# Patient Record
Sex: Female | Born: 1975
Health system: Southern US, Community
[De-identification: ages and names within clinical notes are randomized; demographics above are authoritative.]

## PROBLEM LIST (undated history)

## (undated) DIAGNOSIS — F419 Anxiety disorder, unspecified: Secondary | ICD-10-CM

## (undated) DIAGNOSIS — T7840XA Allergy, unspecified, initial encounter: Secondary | ICD-10-CM

## (undated) DIAGNOSIS — F329 Major depressive disorder, single episode, unspecified: Secondary | ICD-10-CM

## (undated) DIAGNOSIS — K219 Gastro-esophageal reflux disease without esophagitis: Secondary | ICD-10-CM

## (undated) DIAGNOSIS — Z9889 Other specified postprocedural states: Secondary | ICD-10-CM

## (undated) DIAGNOSIS — G4730 Sleep apnea, unspecified: Secondary | ICD-10-CM

## (undated) DIAGNOSIS — R112 Nausea with vomiting, unspecified: Secondary | ICD-10-CM

## (undated) DIAGNOSIS — F32A Depression, unspecified: Secondary | ICD-10-CM

## (undated) DIAGNOSIS — J45909 Unspecified asthma, uncomplicated: Secondary | ICD-10-CM

## (undated) HISTORY — DX: Depression, unspecified: F32.A

## (undated) HISTORY — PX: ESOPHAGOGASTRODUODENOSCOPY: SHX1529

## (undated) HISTORY — PX: SINOSCOPY: SHX187

## (undated) HISTORY — PX: WISDOM TOOTH EXTRACTION: SHX21

## (undated) HISTORY — DX: Major depressive disorder, single episode, unspecified: F32.9

## (undated) HISTORY — DX: Allergy, unspecified, initial encounter: T78.40XA

---

## 2013-09-26 ENCOUNTER — Encounter: Payer: Self-pay | Admitting: *Deleted

## 2013-10-11 ENCOUNTER — Ambulatory Visit (INDEPENDENT_AMBULATORY_CARE_PROVIDER_SITE_OTHER): Payer: BC Managed Care – PPO | Admitting: Family Medicine

## 2013-10-11 ENCOUNTER — Encounter: Payer: Self-pay | Admitting: Family Medicine

## 2013-10-11 VITALS — BP 118/62 | HR 78 | Temp 98.3°F | Resp 16 | Ht 67.0 in | Wt 210.0 lb

## 2013-10-11 DIAGNOSIS — R12 Heartburn: Secondary | ICD-10-CM

## 2013-10-11 DIAGNOSIS — J302 Other seasonal allergic rhinitis: Secondary | ICD-10-CM | POA: Insufficient documentation

## 2013-10-11 DIAGNOSIS — F329 Major depressive disorder, single episode, unspecified: Secondary | ICD-10-CM | POA: Insufficient documentation

## 2013-10-11 DIAGNOSIS — E669 Obesity, unspecified: Secondary | ICD-10-CM

## 2013-10-11 DIAGNOSIS — J309 Allergic rhinitis, unspecified: Secondary | ICD-10-CM

## 2013-10-11 DIAGNOSIS — F331 Major depressive disorder, recurrent, moderate: Secondary | ICD-10-CM

## 2013-10-11 MED ORDER — FLUTICASONE PROPIONATE 50 MCG/ACT NA SUSP: 2.0000 | Freq: Every day | NASAL | Status: DC

## 2013-10-11 MED ORDER — VORTIOXETINE HBR 5 MG PO TABS: ORAL_TABLET | ORAL | Status: DC

## 2013-10-11 MED ORDER — OMEPRAZOLE 20 MG PO CPDR: 20.0000 mg | DELAYED_RELEASE_CAPSULE | Freq: Every day | ORAL | Status: DC

## 2013-10-11 MED ORDER — FLUOXETINE HCL 10 MG PO CAPS: 10.0000 mg | ORAL_CAPSULE | Freq: Every day | ORAL | Status: DC

## 2013-10-11 NOTE — Assessment & Plan Note (Signed)
She continues to work on a regular basis and is trying to improve her diet I think that this will improve she'll be able to lose weight with her get her mood under better control

## 2013-10-11 NOTE — Assessment & Plan Note (Signed)
We'll continue Prilosec

## 2013-10-11 NOTE — Assessment & Plan Note (Signed)
Will try her on Brintillex 5mg  , taper off of the Prozac by going to 10 mg once a day for 2 weeks and then overlapping the second week with the Brintillex, if she does well with this we'll try to increase to 10 mg at her next visit.  She contracted for safety she will call with any concerns with her medications.

## 2013-10-11 NOTE — Assessment & Plan Note (Signed)
-   Continue Flonase  °

## 2013-10-11 NOTE — Progress Notes (Signed)
Patient ID: Carmen Stokes, female   DOB: 11/15/1975, 38 y.o.   MRN: [id number]   Subjective:    Patient ID: Carmen Stokes, female    DOB: 1976/02/20, 38 y.o.   MRN: [id number]  Patient presents for New Patient Establish Care and Medication review  is here to establish care. She was a previous patient cortisone and some refills. She last had what she thinks is a physical exam in August she had fasting blood work at that time. She also has a GYN who she sees every fall. She is scheduled for an examination this fall will also have mammogram.  Medications and history reviewed. She's a history of depression she's had for many years. It also worsens around her menstrual cycle. The past she's been on Lexapro, Wellbutrin, Paxil and currently Prozac. She does not think that the Prozac is helping she does not have a lot of anxiety symptoms but just feels very low as well as low energy weight gain and depressed. She has no desire to do anything. She has had some suicidal thoughts in the past but none recently and no active plan. She's never been hospitalized for her depression. She does have a supportive husband. She does not have any children. She would like to try another medication for her depression. Wellbutrin work fairly well but she did not stay on the medication very long.  Family history was also reviewed there is a strong family history of thyroid disorder she has had her thyroid checked on a regular basis and is been normal.  Allergies she requests a refill on her Flonase acid reflux she started taking Prilosec over-the-counter she gets a lot of gas therefore she takes probiotics her bowels are fairly normal.      Review Of Systems:  GEN- denies fatigue, fever, weight loss,weakness, recent illness HEENT- denies eye drainage, change in vision, nasal discharge, CVS- denies chest pain, palpitations RESP- denies SOB, cough, wheeze ABD- denies N/V, change in stools, abd pain GU- denies dysuria,  hematuria, dribbling, incontinence MSK- denies joint pain, muscle aches, injury Neuro- denies headache, dizziness, syncope, seizure activity       Objective:    BP 118/62  Pulse 78  Temp(Src) 98.3 F (36.8 C) (Oral)  Resp 16  Ht 5\' 7"  (1.702 m)  Wt 210 lb (95.255 kg)  BMI 32.88 kg/m2  LMP 09/27/2013 GEN- NAD, alert and oriented x3 HEENT- PERRL, EOMI, non injected sclera, pink conjunctiva, MMM, oropharynx clear Neck- Supple, no thyromegaly CVS- RRR, no murmur RESP-CTAB ABD-NABS,soft,NT,ND EXT- No edema Psych- normal affect and mood, no SI, well groomed, normal speech, good eye contact Pulses- Radial, DP- 2+        Assessment & Plan:      Problem List Items Addressed This Visit   Seasonal allergies     Continue Flonase    Obesity, unspecified     She continues to work on a regular basis and is trying to improve her diet I think that this will improve she'll be able to lose weight with her get her mood under better control    MDD (major depressive disorder) - Primary     Will try her on Brintillex 5mg  , taper off of the Prozac by going to 10 mg once a day for 2 weeks and then overlapping the second week with the Brintillex, if she does well with this we'll try to increase to 10 mg at her next visit.  She contracted for safety she will call with any  concerns with her medications.     Relevant Medications      Vortioxetine HBr (BRINTELLIX) 5 MG TABS      FLUoxetine (PROZAC) capsule   Heartburn     We'll continue Prilosec       Note: This dictation was prepared with Dragon dictation along with smaller phrase technology. Any transcriptional errors that result from this process are unintentional.

## 2013-10-11 NOTE — Patient Instructions (Signed)
Release of records- [facility] in [place] for [facility]- Dr. Rosario Adie Decrease prozac to 10mg  once a day for 2 weeks Start Brintillex 5mg  on second week of low dose prozac, then continue  F/U 4 weeks for medications

## 2013-11-07 ENCOUNTER — Telehealth: Payer: Self-pay | Admitting: Family Medicine

## 2013-11-07 MED ORDER — VORTIOXETINE HBR 5 MG PO TABS: ORAL_TABLET | ORAL | Status: DC

## 2013-11-07 NOTE — Telephone Encounter (Signed)
[facility]  Patient would like refill on a sample that we gave her of the Vortioxetine HBr (BRINTELLIX) 5 MG TABS If possible, she said she was going to make an appointment for the end of August      [phone] patients number

## 2013-11-07 NOTE — Telephone Encounter (Signed)
Refill appropriate and filled per protocol. 

## 2013-11-09 ENCOUNTER — Telehealth: Payer: Self-pay | Admitting: *Deleted

## 2013-11-09 MED ORDER — BUPROPION HCL ER (SR) 150 MG PO TB12: 150.0000 mg | ORAL_TABLET | Freq: Every day | ORAL | Status: DC

## 2013-11-09 NOTE — Telephone Encounter (Signed)
Message copied by Sheral Flow on Wed Nov 09, 2013 12:19 PM ------      Message from: Lenore Manner      Created: Wed Nov 09, 2013 11:57 AM      Regarding: med      Contact: [phone]       Pt is needin to speak to you about a medication ------

## 2013-11-09 NOTE — Telephone Encounter (Signed)
Prescription sent to pharmacy.   Call placed to patient and patient made aware.   Appointment scheduled.

## 2013-11-09 NOTE — Telephone Encounter (Signed)
Returned call to patient.   Reports that prescription for Brintillex was called in to pharmacy, but she cannot afford it.   Requested to have MD call in new prescription for Wellbutrin. States that she had been on it before and it worked well.   MD please advise.

## 2013-11-09 NOTE — Telephone Encounter (Signed)
Okay to send Wellbutrin 150mg  XL once a day f/u in office 4 weeks

## 2013-12-07 ENCOUNTER — Telehealth: Payer: Self-pay | Admitting: Family Medicine

## 2013-12-07 NOTE — Telephone Encounter (Signed)
Call placed to patient to inquire about request.   VM full.

## 2013-12-07 NOTE — Telephone Encounter (Signed)
Patient is calling to get refill on her wellbutrin but would like to know if she can get the extended release if possible  [employer]

## 2013-12-08 MED ORDER — BUPROPION HCL ER (SR) 150 MG PO TB12: 150.0000 mg | ORAL_TABLET | Freq: Two times a day (BID) | ORAL | Status: DC

## 2013-12-08 NOTE — Telephone Encounter (Signed)
Pt overdue for appt, she can continue current dose of wellbutirn it should not wear off if taking BID

## 2013-12-08 NOTE — Telephone Encounter (Signed)
Call placed to patient.   States that she feels like her medication is wearing off at the end of the day and she was wondering if she could try the extended release.   MD please advise.

## 2013-12-08 NOTE — Telephone Encounter (Signed)
Patient states that she was taking one tablet a day.   Advised to increase to BID and patient has appointment scheduled for 12/19/2013.

## 2013-12-19 ENCOUNTER — Ambulatory Visit (INDEPENDENT_AMBULATORY_CARE_PROVIDER_SITE_OTHER): Payer: BC Managed Care – PPO | Admitting: Family Medicine

## 2013-12-19 ENCOUNTER — Encounter: Payer: Self-pay | Admitting: Family Medicine

## 2013-12-19 VITALS — BP 118/62 | HR 72 | Temp 99.0°F | Resp 12 | Ht 67.0 in | Wt 198.0 lb

## 2013-12-19 DIAGNOSIS — Z0000 Encounter for general adult medical examination without abnormal findings: Secondary | ICD-10-CM

## 2013-12-19 DIAGNOSIS — F331 Major depressive disorder, recurrent, moderate: Secondary | ICD-10-CM

## 2013-12-19 DIAGNOSIS — H9190 Unspecified hearing loss, unspecified ear: Secondary | ICD-10-CM

## 2013-12-19 DIAGNOSIS — Z130 Encounter for screening for diseases of the blood and blood-forming organs and certain disorders involving the immune mechanism: Secondary | ICD-10-CM

## 2013-12-19 DIAGNOSIS — H9191 Unspecified hearing loss, right ear: Secondary | ICD-10-CM

## 2013-12-19 DIAGNOSIS — Z1321 Encounter for screening for nutritional disorder: Secondary | ICD-10-CM

## 2013-12-19 DIAGNOSIS — Z13228 Encounter for screening for other metabolic disorders: Secondary | ICD-10-CM

## 2013-12-19 DIAGNOSIS — Z1329 Encounter for screening for other suspected endocrine disorder: Secondary | ICD-10-CM

## 2013-12-19 DIAGNOSIS — E669 Obesity, unspecified: Secondary | ICD-10-CM

## 2013-12-19 LAB — CBC WITH DIFFERENTIAL/PLATELET
BASOS ABS: 0 10*3/uL (ref 0.0–0.1)
Basophils Relative: 0 % (ref 0–1)
EOS PCT: 2 % (ref 0–5)
Eosinophils Absolute: 0.1 10*3/uL (ref 0.0–0.7)
HEMATOCRIT: 40.4 % (ref 36.0–46.0)
Hemoglobin: 13.4 g/dL (ref 12.0–15.0)
LYMPHS ABS: 1.5 10*3/uL (ref 0.7–4.0)
LYMPHS PCT: 27 % (ref 12–46)
MCH: 29.5 pg (ref 26.0–34.0)
MCHC: 33.2 g/dL (ref 30.0–36.0)
MCV: 89 fL (ref 78.0–100.0)
Monocytes Absolute: 0.4 10*3/uL (ref 0.1–1.0)
Monocytes Relative: 7 % (ref 3–12)
NEUTROS ABS: 3.6 10*3/uL (ref 1.7–7.7)
Neutrophils Relative %: 64 % (ref 43–77)
Platelets: 215 10*3/uL (ref 150–400)
RBC: 4.54 MIL/uL (ref 3.87–5.11)
RDW: 13.7 % (ref 11.5–15.5)
WBC: 5.7 10*3/uL (ref 4.0–10.5)

## 2013-12-19 LAB — COMPREHENSIVE METABOLIC PANEL
ALBUMIN: 4.4 g/dL (ref 3.5–5.2)
ALT: 18 U/L (ref 0–35)
AST: 19 U/L (ref 0–37)
Alkaline Phosphatase: 39 U/L (ref 39–117)
BUN: 16 mg/dL (ref 6–23)
CALCIUM: 9.9 mg/dL (ref 8.4–10.5)
CHLORIDE: 106 meq/L (ref 96–112)
CO2: 23 meq/L (ref 19–32)
Creat: 0.97 mg/dL (ref 0.50–1.10)
Glucose, Bld: 93 mg/dL (ref 70–99)
POTASSIUM: 4.9 meq/L (ref 3.5–5.3)
Sodium: 139 mEq/L (ref 135–145)
Total Bilirubin: 0.4 mg/dL (ref 0.2–1.2)
Total Protein: 6.7 g/dL (ref 6.0–8.3)

## 2013-12-19 LAB — LIPID PANEL
CHOLESTEROL: 165 mg/dL (ref 0–200)
HDL: 66 mg/dL (ref >39)
LDL Cholesterol: 78 mg/dL (ref 0–99)
Total CHOL/HDL Ratio: 2.5 Ratio
Triglycerides: 105 mg/dL (ref <150)
VLDL: 21 mg/dL (ref 0–40)

## 2013-12-19 MED ORDER — OMEPRAZOLE 20 MG PO CPDR: 20.0000 mg | DELAYED_RELEASE_CAPSULE | Freq: Every day | ORAL | Status: DC

## 2013-12-19 MED ORDER — FLUTICASONE PROPIONATE 50 MCG/ACT NA SUSP: 2.0000 | Freq: Every day | NASAL | Status: DC

## 2013-12-19 MED ORDER — BUPROPION HCL ER (SR) 150 MG PO TB12: 150.0000 mg | ORAL_TABLET | Freq: Two times a day (BID) | ORAL | Status: DC

## 2013-12-19 NOTE — Patient Instructions (Signed)
Call if the hearing changes, then I can refer you to ENT for further evaluation I recommend eye visit once a year I recommend dental visit every 6 months Goal is to  Exercise 30 minutes 5 days a week We will send a letter with lab results if normal F/U 6 months

## 2013-12-19 NOTE — Progress Notes (Signed)
Patient ID: Carmen Stokes, female   DOB: 07/14/1975, 38 y.o.   MRN: [id number]   Subjective:    Patient ID: Carmen Stokes, female    DOB: October 29, 1975, 38 y.o.   MRN: [id number]  Patient presents for CPE and F/U antidepressant  patient here for complete physical exam. She is scheduled with her GYN for her mammogram and Pap smear. Her immunizations are up-to-date. She is due for fasting labs. She's lost 12 pounds intentionally since her last visit and continues with her weight loss plan and exercise his pain. She's been seen by the eye doctor and dentist with no specific concerns.  She has noticed that she had a couple episodes where her hearing was decreased in her right ear. She denies any sinus congestion or cold symptoms at that time. It does not last very long maybe a minute or so. No tinnitus no ear pain no drainage   Medications were reviewed    Review Of Systems: - per above   GEN- denies fatigue, fever, weight loss,weakness, recent illness HEENT- denies eye drainage, change in vision, nasal discharge, CVS- denies chest pain, palpitations RESP- denies SOB, cough, wheeze ABD- denies N/V, change in stools, abd pain GU- denies dysuria, hematuria, dribbling, incontinence MSK- denies joint pain, muscle aches, injury Neuro- denies headache, dizziness, syncope, seizure activity       Objective:    BP 118/62  Pulse 72  Temp(Src) 99 F (37.2 C) (Oral)  Resp 12  Ht 5\' 7"  (1.702 m)  Wt 198 lb (89.812 kg)  BMI 31.00 kg/m2  LMP 11/28/2013 GEN- NAD, alert and oriented x3 HEENT- PERRL, EOMI, non injected sclera, pink conjunctiva, MMM, oropharynx clear, nares clear, TM clear bilat no wax, canals clear, hearing grossly in tact Neck- Supple, no thyromegaly CVS- RRR, no murmur RESP-CTAB ABD-NABS,soft,NT,ND EXT- No edema Psych- normal affect and mood  Pulses- Radial, DP- 2+        Assessment & Plan:      Problem List Items Addressed This Visit   Routine general medical  examination at a health care facility     PAP and Mammogram per GYN Fasting labs today TDAP UTD, flu in fall  Continue with diet and weight loss    Relevant Orders      CBC with Differential      Comprehensive metabolic panel      Lipid panel   Obesity, unspecified     Congratulated on weight loss, continue with diet and exercise    MDD (major depressive disorder) - Primary     Stable on current dose of wellbutrin will continue medications  RTC 6 months     Other Visit Diagnoses   Encounter for vitamin deficiency screening        Relevant Orders       Vitamin D, 25-hydroxy       Note: This dictation was prepared with Dragon dictation along with smaller phrase technology. Any transcriptional errors that result from this process are unintentional.

## 2013-12-19 NOTE — Assessment & Plan Note (Signed)
PAP and Mammogram per GYN Fasting labs today TDAP UTD, flu in fall  Continue with diet and weight loss

## 2013-12-19 NOTE — Addendum Note (Signed)
Addended by: Vic Blackbird F on: 12/19/2013 09:01 AM   Modules accepted: Orders

## 2013-12-19 NOTE — Assessment & Plan Note (Signed)
Normal examination and hearing screen, if this persist will send to ENT May have had some fluid in ear

## 2013-12-19 NOTE — Assessment & Plan Note (Signed)
Stable on current dose of wellbutrin will continue medications  RTC 6 months

## 2013-12-19 NOTE — Assessment & Plan Note (Signed)
Congratulated on weight loss, continue with diet and exercise

## 2013-12-20 ENCOUNTER — Encounter: Payer: Self-pay | Admitting: *Deleted

## 2013-12-20 LAB — VITAMIN D 25 HYDROXY (VIT D DEFICIENCY, FRACTURES): Vit D, 25-Hydroxy: 81 ng/mL (ref 30–89)

## 2013-12-28 ENCOUNTER — Telehealth: Payer: Self-pay | Admitting: Family Medicine

## 2013-12-28 NOTE — Telephone Encounter (Signed)
Call returned to patient.   Reports that she has had sinus pressure and nasal congestion x1 week, but now she has chest congestion and productive cough with green mucus.   MD please advise.

## 2013-12-28 NOTE — Telephone Encounter (Signed)
Send zpak, take mucinex DM

## 2013-12-28 NOTE — Telephone Encounter (Signed)
[phone] [facility]  Patient is calling to say she has a lot of congestion would like to know if dr Buelah Manis could call something in for her

## 2013-12-28 NOTE — Telephone Encounter (Signed)
Call placed to patient. LMTRC.  

## 2013-12-29 MED ORDER — AZITHROMYCIN 250 MG PO TABS: ORAL_TABLET | ORAL | Status: DC

## 2013-12-29 NOTE — Telephone Encounter (Signed)
Call placed to patient and patient made aware.   Prescription sent to pharmacy.  

## 2014-04-11 ENCOUNTER — Other Ambulatory Visit: Payer: Self-pay | Admitting: Family Medicine

## 2014-11-26 ENCOUNTER — Other Ambulatory Visit: Payer: Self-pay | Admitting: Family Medicine

## 2014-11-27 NOTE — Telephone Encounter (Signed)
Medication filled x1 with no refills.   Requires office visit before any further refills can be given.   Letter sent.  

## 2014-12-30 ENCOUNTER — Other Ambulatory Visit: Payer: Self-pay | Admitting: Family Medicine

## 2015-01-01 ENCOUNTER — Encounter: Payer: Self-pay | Admitting: Family Medicine

## 2015-01-01 ENCOUNTER — Other Ambulatory Visit: Payer: Self-pay | Admitting: Family Medicine

## 2015-01-01 NOTE — Telephone Encounter (Signed)
Medication refill for one time only.  Patient needs to be seen.  Letter sent for patient to call and schedule 

## 2015-01-01 NOTE — Telephone Encounter (Signed)
Medication filled x1 with no refills.   Requires office visit before any further refills can be given.   Letter sent.  

## 2015-01-28 ENCOUNTER — Other Ambulatory Visit: Payer: Self-pay | Admitting: Family Medicine

## 2015-01-29 NOTE — Telephone Encounter (Signed)
Refill appropriate and filled per protocol. 

## 2015-02-05 ENCOUNTER — Other Ambulatory Visit: Payer: Self-pay | Admitting: Family Medicine

## 2015-02-05 ENCOUNTER — Other Ambulatory Visit: Payer: BLUE CROSS/BLUE SHIELD

## 2015-02-05 DIAGNOSIS — Z79899 Other long term (current) drug therapy: Secondary | ICD-10-CM

## 2015-02-05 DIAGNOSIS — E669 Obesity, unspecified: Secondary | ICD-10-CM

## 2015-02-05 DIAGNOSIS — F339 Major depressive disorder, recurrent, unspecified: Secondary | ICD-10-CM

## 2015-02-05 DIAGNOSIS — Z0000 Encounter for general adult medical examination without abnormal findings: Secondary | ICD-10-CM

## 2015-02-05 LAB — CBC WITH DIFFERENTIAL/PLATELET
BASOS ABS: 0 10*3/uL (ref 0.0–0.1)
BASOS PCT: 0 % (ref 0–1)
Eosinophils Absolute: 0.2 10*3/uL (ref 0.0–0.7)
Eosinophils Relative: 2 % (ref 0–5)
HCT: 39.8 % (ref 36.0–46.0)
HEMOGLOBIN: 13.5 g/dL (ref 12.0–15.0)
Lymphocytes Relative: 31 % (ref 12–46)
Lymphs Abs: 2.4 10*3/uL (ref 0.7–4.0)
MCH: 30.8 pg (ref 26.0–34.0)
MCHC: 33.9 g/dL (ref 30.0–36.0)
MCV: 90.9 fL (ref 78.0–100.0)
MONO ABS: 0.5 10*3/uL (ref 0.1–1.0)
MPV: 10.5 fL (ref 8.6–12.4)
Monocytes Relative: 7 % (ref 3–12)
NEUTROS ABS: 4.7 10*3/uL (ref 1.7–7.7)
NEUTROS PCT: 60 % (ref 43–77)
Platelets: 216 10*3/uL (ref 150–400)
RBC: 4.38 MIL/uL (ref 3.87–5.11)
RDW: 13.7 % (ref 11.5–15.5)
WBC: 7.8 10*3/uL (ref 4.0–10.5)

## 2015-02-05 LAB — COMPLETE METABOLIC PANEL WITH GFR
ALBUMIN: 4.5 g/dL (ref 3.6–5.1)
ALK PHOS: 49 U/L (ref 33–115)
ALT: 16 U/L (ref 6–29)
AST: 17 U/L (ref 10–30)
BILIRUBIN TOTAL: 0.5 mg/dL (ref 0.2–1.2)
BUN: 21 mg/dL (ref 7–25)
CALCIUM: 9.4 mg/dL (ref 8.6–10.2)
CO2: 24 mmol/L (ref 20–31)
CREATININE: 0.91 mg/dL (ref 0.50–1.10)
Chloride: 103 mmol/L (ref 98–110)
GFR, Est African American: >89 mL/min (ref >=60)
GFR, Est Non African American: 79 mL/min (ref >=60)
GLUCOSE: 68 mg/dL — AB (ref 70–99)
Potassium: 4.9 mmol/L (ref 3.5–5.3)
SODIUM: 138 mmol/L (ref 135–146)
TOTAL PROTEIN: 7.1 g/dL (ref 6.1–8.1)

## 2015-02-05 LAB — LIPID PANEL
Cholesterol: 189 mg/dL (ref 125–200)
HDL: 96 mg/dL (ref >=46)
LDL CALC: 72 mg/dL (ref <130)
Total CHOL/HDL Ratio: 2 Ratio (ref <=5.0)
Triglycerides: 107 mg/dL (ref <150)
VLDL: 21 mg/dL (ref <30)

## 2015-02-06 LAB — TSH: TSH: 1.773 u[IU]/mL (ref 0.350–4.500)

## 2015-02-09 ENCOUNTER — Encounter: Payer: Self-pay | Admitting: Family Medicine

## 2015-02-28 ENCOUNTER — Other Ambulatory Visit: Payer: Self-pay | Admitting: Family Medicine

## 2015-03-01 NOTE — Telephone Encounter (Signed)
Refill appropriate and filled per protocol. 

## 2015-03-05 ENCOUNTER — Encounter: Payer: Self-pay | Admitting: Family Medicine

## 2015-03-05 ENCOUNTER — Ambulatory Visit (INDEPENDENT_AMBULATORY_CARE_PROVIDER_SITE_OTHER): Payer: BLUE CROSS/BLUE SHIELD | Admitting: Family Medicine

## 2015-03-05 VITALS — BP 128/68 | HR 74 | Temp 98.8°F | Resp 12 | Ht 67.0 in | Wt 212.0 lb

## 2015-03-05 DIAGNOSIS — F332 Major depressive disorder, recurrent severe without psychotic features: Secondary | ICD-10-CM | POA: Diagnosis not present

## 2015-03-05 DIAGNOSIS — K589 Irritable bowel syndrome without diarrhea: Secondary | ICD-10-CM

## 2015-03-05 DIAGNOSIS — Z23 Encounter for immunization: Secondary | ICD-10-CM | POA: Diagnosis not present

## 2015-03-05 DIAGNOSIS — Z0000 Encounter for general adult medical examination without abnormal findings: Secondary | ICD-10-CM

## 2015-03-05 DIAGNOSIS — E669 Obesity, unspecified: Secondary | ICD-10-CM

## 2015-03-05 MED ORDER — SERTRALINE HCL 50 MG PO TABS: 50.0000 mg | ORAL_TABLET | Freq: Every day | ORAL | Status: DC

## 2015-03-05 NOTE — Assessment & Plan Note (Signed)
Complete physical done. She will follow-up with her GYN for her mammogram. Flu shot given. Fasting labs reviewed which were normal.

## 2015-03-05 NOTE — Patient Instructions (Signed)
Try the Align or Culturelle Probiotic  add Benefiber or Metamucil for the bloating  Start the zoloft 50mg  once a day  Continue wellbutrin in the morning FLu shot given  F/U 6 weeks

## 2015-03-05 NOTE — Assessment & Plan Note (Signed)
Trial of probiotic daily as well as fiber supplement such as Benefiber or Metamucil

## 2015-03-05 NOTE — Progress Notes (Signed)
Patient ID: Carmen Stokes, female   DOB: 1975/12/14, 39 y.o.   MRN: [id number]   Subjective:    Patient ID: Carmen Stokes, female    DOB: [birth year], 39 y.o.   MRN: [id number]  Patient presents for CPE and Medication Management  PAP Smear followed by GYN  TDAP- UTD  Flu shot due Mammogram- per GYN  fasting labs Reviewed  History of depression- currently on wellbutrin, has been on multiple meds in past, Effexor, prozac, Trintillex, Lexapro, and had to stop for various reasons, the BID wellbutrin caused her to feel funny at the end of the day, so she has only been taking 150mg  in AM, she has had a lot of stress this year, her husbands, father passed away, his grandmother also passed, she injured her foot and was in boot for 2 months. She noticed more irritability and mood swings with everything going on especially around her cycle. She is requesting something else to help with mood.  She is in church and has support with a women's group and from her husband.   Gas and bloating, worsened over the past few months, typically has some mild constipation but no diarrhea, no change with eating, she does take PPI for GERD, took probiotics in past but has not been taking regulary. No blood in stool   Review Of Systems:  GEN- denies fatigue, fever, weight loss,weakness, recent illness HEENT- denies eye drainage, change in vision, nasal discharge, CVS- denies chest pain, palpitations RESP- denies SOB, cough, wheeze ABD- denies N/V, change in stools, abd pain GU- denies dysuria, hematuria, dribbling, incontinence MSK- denies joint pain, muscle aches, injury Neuro- denies headache, dizziness, syncope, seizure activity       Objective:    BP 128/68 mmHg  Pulse 74  Temp(Src) 98.8 F (37.1 C) (Oral)  Resp 12  Ht 5\' 7"  (1.702 m)  Wt 212 lb (96.163 kg)  BMI 33.20 kg/m2  LMP 02/12/2015 (Approximate) GEN- NAD, alert and oriented x3 HEENT- PERRL, EOMI, non injected sclera, pink conjunctiva, MMM,  oropharynx clear Neck- Supple, no thyromegaly CVS- RRR, no murmur RESP-CTAB ABD-NABS,soft,NT,ND Psych- normal affect and mood, no SI, not anxious appearing EXT- No edema Pulses- Radial, DP- 2+        Assessment & Plan:      Problem List Items Addressed This Visit    None      Note: This dictation was prepared with Dragon dictation along with smaller phrase technology. Any transcriptional errors that result from this process are unintentional.

## 2015-03-05 NOTE — Assessment & Plan Note (Signed)
Will add Zoloft 50 mg to her Wellbutrin 150 mg every morning we will follow-up by phone in a couple weeks

## 2015-05-27 ENCOUNTER — Other Ambulatory Visit: Payer: Self-pay | Admitting: Family Medicine

## 2015-05-28 NOTE — Telephone Encounter (Signed)
Refill appropriate and filled per protocol. 

## 2015-05-31 ENCOUNTER — Encounter: Payer: Self-pay | Admitting: Physician Assistant

## 2015-05-31 ENCOUNTER — Encounter: Payer: Self-pay | Admitting: Family Medicine

## 2015-05-31 ENCOUNTER — Ambulatory Visit (INDEPENDENT_AMBULATORY_CARE_PROVIDER_SITE_OTHER): Payer: BLUE CROSS/BLUE SHIELD | Admitting: Physician Assistant

## 2015-05-31 ENCOUNTER — Other Ambulatory Visit: Payer: Self-pay | Admitting: Physician Assistant

## 2015-05-31 VITALS — BP 114/80 | HR 68 | Temp 99.1°F | Resp 18 | Wt 219.0 lb

## 2015-05-31 DIAGNOSIS — R509 Fever, unspecified: Secondary | ICD-10-CM | POA: Diagnosis not present

## 2015-05-31 DIAGNOSIS — J029 Acute pharyngitis, unspecified: Secondary | ICD-10-CM

## 2015-05-31 LAB — INFLUENZA A AND B AG, IMMUNOASSAY
INFLUENZA A ANTIGEN: NOT DETECTED
INFLUENZA B ANTIGEN: NOT DETECTED

## 2015-05-31 LAB — STREP GROUP A AG, W/REFLEX TO CULT: STREGTOCOCCUS GROUP A AG SCREEN: NOT DETECTED

## 2015-05-31 NOTE — Progress Notes (Signed)
Patient ID: Carmen Stokes MRN: [id number], DOB: [birth year], 40 y.o. Date of Encounter: 05/31/2015, 1:42 PM    Chief Complaint:  Chief Complaint  Patient presents with  . sick x 4 days    fever,chills, aches, sore throat,, started zpak that she had at home that she had not used from before     HPI: 40 y.o. year old white female presents with above. Says that Monday night she had a headache. Woke up at 2 AM with sore throat. That day coughed up some phlegm. On Tuesday felt achy with chills and had temperature max at 101. Yesterday she stayed home from work had a low-grade fever and sore throat. This morning had sore throat that was even worse than it has been. Says that she has had no congestion in her head and nose and no mucus from the nose. Says that everything has been in her throat except for just a little bit in her chest. Says that she was prescribed a Z-Pak a few months ago that she did not use so she started this on Monday night.     Home Meds:   Outpatient Prescriptions Prior to Visit  Medication Sig Dispense Refill  . buPROPion (WELLBUTRIN SR) 150 MG 12 hr tablet TAKE 1 TABLET BY MOUTH TWICE A DAY (Patient taking differently: TAKE 1 TABLET BY MOUTH ONCE A DAY) 60 tablet 3  . fluticasone (FLONASE) 50 MCG/ACT nasal spray PLACE 2 SPRAYS INTO BOTH NOSTRILS DAILY. 16 g 12  . omeprazole (PRILOSEC) 20 MG capsule TAKE ONE CAPSULE BY MOUTH EVERY DAY (NEED TO BE SEEN BEFORE ANY FURTHER REILLS) 30 capsule 3  . Probiotic Product (PROBIOTIC DAILY PO) Take 1 tablet by mouth daily.    . sertraline (ZOLOFT) 50 MG tablet Take 1 tablet (50 mg total) by mouth daily. 30 tablet 3   No facility-administered medications prior to visit.    Allergies: No Known Allergies    Review of Systems: See HPI for pertinent ROS. All other ROS negative.    Physical Exam: Blood pressure 114/80, pulse 68, temperature 99.1 F (37.3 C), temperature source Oral, resp. rate 18, weight 219 lb (99.338 kg).,  Body mass index is 34.29 kg/(m^2). General:  WNWD WF. Appears in no acute distress. HEENT: Normocephalic, atraumatic, eyes without discharge, sclera non-icteric, nares are without discharge. Bilateral auditory canals clear, TM's are without perforation, pearly grey and translucent with reflective cone of light bilaterally. Oral cavity moist, posterior pharynx without exudate, erythema, peritonsillar abscess.  Neck: Supple. No thyromegaly. No lymphadenopathy. Lungs: Clear bilaterally to auscultation without wheezes, rales, or rhonchi. Breathing is unlabored. Heart: Regular rhythm. No murmurs, rubs, or gallops. Msk:  Strength and tone normal for age. Extremities/Skin: Warm and dry. No rashes. Neuro: Alert and oriented X 3. Moves all extremities spontaneously. Gait is normal. CNII-XII grossly in tact. Psych:  Responds to questions appropriately with a normal affect.   Results for orders placed or performed in visit on 05/31/15  STREP GROUP A AG, W/REFLEX TO CULT  Result Value Ref Range   SOURCE THROAT    STREGTOCOCCUS GROUP A AG SCREEN Not Detected      ASSESSMENT AND PLAN:  40 y.o. year old female with  1. Viral pharyngitis Discussed with her that this is likely viral. She has been on Z-Pak since the beginning of symptoms and symptoms have persisted. Strep test is negative. Symptomatic management with lozenges and spray. Follow-up if symptoms persist 7-10 days without resolution. - STREP GROUP A AG,  W/REFLEX TO CULT  2. Fever and chills - Influenza a and b - STREP GROUP A AG, W/REFLEX TO CULT   Signed, [address] [place], [place], [facility] 05/31/2015 1:42 PM

## 2015-06-01 ENCOUNTER — Other Ambulatory Visit: Payer: Self-pay | Admitting: Family Medicine

## 2015-06-01 NOTE — Telephone Encounter (Signed)
Refill appropriate and filled per protocol. 

## 2015-06-19 ENCOUNTER — Other Ambulatory Visit: Payer: Self-pay | Admitting: *Deleted

## 2015-06-19 MED ORDER — OMEPRAZOLE 20 MG PO CPDR: DELAYED_RELEASE_CAPSULE | ORAL | Status: DC

## 2015-06-19 NOTE — Telephone Encounter (Signed)
Received fax requesting refill on Omeprazole.   Refill appropriate and filled per protocol. 

## 2015-07-10 ENCOUNTER — Other Ambulatory Visit: Payer: Self-pay | Admitting: Family Medicine

## 2015-07-10 NOTE — Telephone Encounter (Signed)
Refill appropriate and filled per protocol. 

## 2015-07-21 ENCOUNTER — Other Ambulatory Visit: Payer: Self-pay | Admitting: Family Medicine

## 2015-07-23 NOTE — Telephone Encounter (Signed)
Refill appropriate and filled per protocol. 

## 2015-08-14 ENCOUNTER — Other Ambulatory Visit: Payer: Self-pay | Admitting: *Deleted

## 2015-08-14 MED ORDER — BUPROPION HCL ER (SR) 150 MG PO TB12: 150.0000 mg | ORAL_TABLET | Freq: Two times a day (BID) | ORAL | Status: DC

## 2015-08-14 NOTE — Telephone Encounter (Signed)
Received fax requesting refill on welbutrin.   Refill appropriate and filled per protocol.

## 2015-09-20 ENCOUNTER — Other Ambulatory Visit: Payer: Self-pay | Admitting: Family Medicine

## 2015-09-20 NOTE — Telephone Encounter (Signed)
Medication refilled per protocol. 

## 2015-11-07 ENCOUNTER — Other Ambulatory Visit: Payer: Self-pay | Admitting: Family Medicine

## 2016-01-04 ENCOUNTER — Telehealth: Payer: Self-pay | Admitting: *Deleted

## 2016-01-04 MED ORDER — SERTRALINE HCL 50 MG PO TABS: ORAL_TABLET | ORAL | 3 refills | Status: DC

## 2016-01-04 MED ORDER — BUPROPION HCL ER (SR) 150 MG PO TB12: 150.0000 mg | ORAL_TABLET | Freq: Two times a day (BID) | ORAL | 1 refills | Status: DC

## 2016-01-04 NOTE — Telephone Encounter (Signed)
Received fax requesting refill on Bupropion and Zoloft.   Refill appropriate and filled per protocol.

## 2016-01-17 ENCOUNTER — Other Ambulatory Visit: Payer: Self-pay | Admitting: *Deleted

## 2016-01-17 MED ORDER — BUPROPION HCL ER (SR) 150 MG PO TB12: 150.0000 mg | ORAL_TABLET | Freq: Two times a day (BID) | ORAL | 1 refills | Status: DC

## 2016-01-17 NOTE — Telephone Encounter (Signed)
Received fax requesting refill on Bupropion and Zoloft.   Refill appropriate and filled per protocol.

## 2016-03-10 ENCOUNTER — Other Ambulatory Visit: Payer: Self-pay | Admitting: Family Medicine

## 2016-03-24 ENCOUNTER — Other Ambulatory Visit: Payer: Self-pay | Admitting: Family Medicine

## 2016-06-24 ENCOUNTER — Other Ambulatory Visit: Payer: Self-pay | Admitting: Family Medicine

## 2016-06-25 ENCOUNTER — Other Ambulatory Visit: Payer: Self-pay | Admitting: Family Medicine

## 2016-07-30 ENCOUNTER — Other Ambulatory Visit: Payer: Self-pay | Admitting: *Deleted

## 2016-07-30 MED ORDER — FLUTICASONE PROPIONATE 50 MCG/ACT NA SUSP: NASAL | 3 refills | Status: DC

## 2016-08-19 ENCOUNTER — Ambulatory Visit (INDEPENDENT_AMBULATORY_CARE_PROVIDER_SITE_OTHER): Payer: BLUE CROSS/BLUE SHIELD | Admitting: Family Medicine

## 2016-08-19 ENCOUNTER — Encounter: Payer: Self-pay | Admitting: Family Medicine

## 2016-08-19 VITALS — BP 112/74 | HR 70 | Temp 98.7°F | Resp 14 | Ht 67.0 in | Wt 241.0 lb

## 2016-08-19 DIAGNOSIS — F332 Major depressive disorder, recurrent severe without psychotic features: Secondary | ICD-10-CM | POA: Diagnosis not present

## 2016-08-19 DIAGNOSIS — E6609 Other obesity due to excess calories: Secondary | ICD-10-CM | POA: Diagnosis not present

## 2016-08-19 DIAGNOSIS — Z6837 Body mass index (BMI) 37.0-37.9, adult: Secondary | ICD-10-CM

## 2016-08-19 DIAGNOSIS — J301 Allergic rhinitis due to pollen: Secondary | ICD-10-CM

## 2016-08-19 DIAGNOSIS — G4739 Other sleep apnea: Secondary | ICD-10-CM

## 2016-08-19 DIAGNOSIS — G4730 Sleep apnea, unspecified: Secondary | ICD-10-CM | POA: Insufficient documentation

## 2016-08-19 DIAGNOSIS — Z0000 Encounter for general adult medical examination without abnormal findings: Secondary | ICD-10-CM

## 2016-08-19 LAB — TSH: TSH: 1.13 mIU/L

## 2016-08-19 LAB — CBC WITH DIFFERENTIAL/PLATELET
BASOS ABS: 0 {cells}/uL (ref 0–200)
BASOS PCT: 0 %
EOS ABS: 172 {cells}/uL (ref 15–500)
EOS PCT: 2 %
HCT: 41.5 % (ref 35.0–45.0)
HEMOGLOBIN: 13.3 g/dL (ref 12.0–15.0)
LYMPHS ABS: 1806 {cells}/uL (ref 850–3900)
Lymphocytes Relative: 21 %
MCH: 28.9 pg (ref 27.0–33.0)
MCHC: 32 g/dL (ref 32.0–36.0)
MCV: 90 fL (ref 80.0–100.0)
MPV: 10.7 fL (ref 7.5–12.5)
Monocytes Absolute: 430 cells/uL (ref 200–950)
Monocytes Relative: 5 %
NEUTROS ABS: 6192 {cells}/uL (ref 1500–7800)
Neutrophils Relative %: 72 %
Platelets: 256 10*3/uL (ref 140–400)
RBC: 4.61 MIL/uL (ref 3.80–5.10)
RDW: 13.8 % (ref 11.0–15.0)
WBC: 8.6 10*3/uL (ref 3.8–10.8)

## 2016-08-19 LAB — LIPID PANEL
CHOL/HDL RATIO: 2.5 ratio (ref <5.0)
Cholesterol: 158 mg/dL (ref <200)
HDL: 64 mg/dL (ref >50)
LDL CALC: 57 mg/dL (ref <100)
Triglycerides: 183 mg/dL — ABNORMAL HIGH (ref <150)
VLDL: 37 mg/dL — AB (ref <30)

## 2016-08-19 LAB — CMP 10231
AG RATIO: 1.6 ratio (ref 1.0–2.5)
ALBUMIN: 4.1 g/dL (ref 3.6–5.1)
ALK PHOS: 48 U/L (ref 33–115)
ALT: 28 U/L (ref 6–29)
AST: 23 U/L (ref 10–30)
BILIRUBIN TOTAL: 0.3 mg/dL (ref 0.2–1.2)
BUN/Creatinine Ratio: 20.2 Ratio (ref 6–22)
BUN: 18 mg/dL (ref 7–25)
CO2: 25 mmol/L (ref 20–31)
Calcium: 9.2 mg/dL (ref 8.6–10.2)
Chloride: 104 mmol/L (ref 98–110)
Creat: 0.89 mg/dL (ref 0.50–1.10)
GFR, Est African American: >89 mL/min (ref >=60)
GFR, Est Non African American: 80 mL/min (ref >=60)
GLOBULIN: 2.6 g/dL (ref 1.9–3.7)
GLUCOSE: 91 mg/dL (ref 70–99)
Potassium: 4.8 mmol/L (ref 3.5–5.3)
Sodium: 138 mmol/L (ref 135–146)
TOTAL PROTEIN: 6.7 g/dL (ref 6.1–8.1)

## 2016-08-19 MED ORDER — SERTRALINE HCL 50 MG PO TABS: ORAL_TABLET | ORAL | 3 refills | Status: DC

## 2016-08-19 MED ORDER — OMEPRAZOLE 20 MG PO CPDR: 20.0000 mg | DELAYED_RELEASE_CAPSULE | Freq: Every day | ORAL | 3 refills | Status: DC

## 2016-08-19 MED ORDER — BUPROPION HCL ER (SR) 150 MG PO TB12: 150.0000 mg | ORAL_TABLET | Freq: Every day | ORAL | 3 refills | Status: DC

## 2016-08-19 NOTE — Patient Instructions (Addendum)
Release of records [facility] PAP Smear  Xyzal instead of zyrtec  Home Sleep study We will call with lab results  Call if you want to try the [place]  F/U 1 year for Physical

## 2016-08-19 NOTE — Progress Notes (Signed)
Subjective:    Patient ID: Carmen Stokes, female    DOB: [birth year], 41 y.o.   MRN: [id number]  Patient presents for CPE (is fasting)  Pt here for CPE Medications and history reviewed GYN- [facility] ( States mammo will be done at age 48) Immunizations- TDAP UTD Due for fasting labs   The past few months he's had increasing difficulty with fatigue. She feels like her sleep is very restless. Her husband notes that she snores a lot and at times she stopped breathing. She's also had some headaches on and off. They're concerned about sleep apnea. She denies any chest pain or shortness of breath.  Seasonal allergies they seem worse this year. She is using Zyrtec and Flonase also uses an allergy eyedrop as needed.  Obesity she recently joined [employer]. In the past 3 weeks she has lost 10 pounds. Her problems that she tends to overeat. She also joined the [employer] to start working out.  REVIEW of meds she only takes wellbutrin once a day has been doing > 1 year, feels irritable and she takes it twice a day. She also takes her Zoloft. She feels like her mood is controlled with both medications.  Review Of Systems:  GEN- denies fatigue, fever, weight loss,weakness, recent illness HEENT- denies eye drainage, change in vision, nasal discharge, CVS- denies chest pain, palpitations RESP- denies SOB, cough, wheeze ABD- denies N/V, change in stools, abd pain GU- denies dysuria, hematuria, dribbling, incontinence MSK- denies joint pain, muscle aches, injury Neuro-+headache, denies dizziness, syncope, seizure activity       Objective:    BP 112/74   Pulse 70   Temp 98.7 F (37.1 C) (Oral)   Resp 14   Ht 5\' 7"  (1.702 m)   Wt 241 lb (109.3 kg)   SpO2 99%   BMI 37.75 kg/m  GEN- NAD, alert and oriented x3,obese  HEENT- PERRL, EOMI, non injected sclera, pink conjunctiva, MMM, oropharynx clear, nares clear ,TM Clear bilat  Neck- Supple, no thyromegaly CVS- RRR,  no murmur RESP-CTAB ABD-NABS,soft,NT,ND Psych- normal affect and mood  EXT- No edema Pulses- Radial, DP- 2+        Assessment & Plan:      Problem List Items Addressed This Visit    Sleep apnea    Concern for sleep apnea based on history and obesity  Set up for sleep study       Seasonal allergies    Trial of xyzal, can also take benadryl at bedtime       Routine general medical examination at a health care facility - Primary    CPE done, fasting labs and form for her work to be completed Discussed nutrition, she will continue with weight watchers. We also discussed Saxenda weight loss medication, given information to review to help with overeating. Start walking or swimming at [facility]       Relevant Orders   CBC with Differential/Platelet   Comprehensive metabolic panel   TSH   Lipid panel   Obesity, unspecified   Relevant Orders   Lipid panel   MDD (major depressive disorder)    Continue wellbutrin once a day and zoloft doing well on medication      Relevant Medications   buPROPion (WELLBUTRIN SR) 150 MG 12 hr tablet   sertraline (ZOLOFT) 50 MG tablet      Note: This dictation was prepared with Dragon dictation along with smaller phrase technology. Any transcriptional errors that result from this  process are unintentional.

## 2016-08-19 NOTE — Assessment & Plan Note (Signed)
Concern for sleep apnea based on history and obesity  Set up for sleep study

## 2016-08-19 NOTE — Assessment & Plan Note (Signed)
Continue wellbutrin once a day and zoloft doing well on medication

## 2016-08-19 NOTE — Assessment & Plan Note (Signed)
Trial of xyzal, can also take benadryl at bedtime

## 2016-08-19 NOTE — Assessment & Plan Note (Signed)
CPE done, fasting labs and form for her work to be completed Discussed nutrition, she will continue with weight watchers. We also discussed Saxenda weight loss medication, given information to review to help with overeating. Start walking or swimming at [facility]

## 2016-08-20 ENCOUNTER — Encounter: Payer: Self-pay | Admitting: *Deleted

## 2016-12-29 ENCOUNTER — Other Ambulatory Visit: Payer: Self-pay | Admitting: Family Medicine

## 2017-02-05 ENCOUNTER — Other Ambulatory Visit: Payer: Self-pay | Admitting: Family Medicine

## 2017-02-25 ENCOUNTER — Ambulatory Visit: Payer: BLUE CROSS/BLUE SHIELD | Admitting: Family Medicine

## 2017-03-30 ENCOUNTER — Other Ambulatory Visit: Payer: Self-pay | Admitting: Family Medicine

## 2017-05-06 DIAGNOSIS — Z309 Encounter for contraceptive management, unspecified: Secondary | ICD-10-CM | POA: Diagnosis not present

## 2017-05-06 DIAGNOSIS — J40 Bronchitis, not specified as acute or chronic: Secondary | ICD-10-CM | POA: Diagnosis not present

## 2017-05-06 DIAGNOSIS — K219 Gastro-esophageal reflux disease without esophagitis: Secondary | ICD-10-CM | POA: Diagnosis not present

## 2017-05-06 DIAGNOSIS — J3489 Other specified disorders of nose and nasal sinuses: Secondary | ICD-10-CM | POA: Diagnosis not present

## 2017-05-06 DIAGNOSIS — Z6832 Body mass index (BMI) 32.0-32.9, adult: Secondary | ICD-10-CM | POA: Diagnosis not present

## 2017-05-06 DIAGNOSIS — F329 Major depressive disorder, single episode, unspecified: Secondary | ICD-10-CM | POA: Diagnosis not present

## 2017-05-06 DIAGNOSIS — Z87891 Personal history of nicotine dependence: Secondary | ICD-10-CM | POA: Diagnosis not present

## 2017-05-06 DIAGNOSIS — Z299 Encounter for prophylactic measures, unspecified: Secondary | ICD-10-CM | POA: Diagnosis not present

## 2017-05-06 DIAGNOSIS — F419 Anxiety disorder, unspecified: Secondary | ICD-10-CM | POA: Diagnosis not present

## 2017-05-14 DIAGNOSIS — J309 Allergic rhinitis, unspecified: Secondary | ICD-10-CM | POA: Diagnosis not present

## 2017-05-14 DIAGNOSIS — J40 Bronchitis, not specified as acute or chronic: Secondary | ICD-10-CM | POA: Diagnosis not present

## 2017-05-14 DIAGNOSIS — K219 Gastro-esophageal reflux disease without esophagitis: Secondary | ICD-10-CM | POA: Diagnosis not present

## 2017-05-14 DIAGNOSIS — F419 Anxiety disorder, unspecified: Secondary | ICD-10-CM | POA: Diagnosis not present

## 2017-05-14 DIAGNOSIS — Z299 Encounter for prophylactic measures, unspecified: Secondary | ICD-10-CM | POA: Diagnosis not present

## 2017-05-14 DIAGNOSIS — Z6832 Body mass index (BMI) 32.0-32.9, adult: Secondary | ICD-10-CM | POA: Diagnosis not present

## 2017-08-21 ENCOUNTER — Ambulatory Visit (INDEPENDENT_AMBULATORY_CARE_PROVIDER_SITE_OTHER): Payer: BLUE CROSS/BLUE SHIELD | Admitting: Physician Assistant

## 2017-08-21 ENCOUNTER — Encounter: Payer: Self-pay | Admitting: Physician Assistant

## 2017-08-21 VITALS — BP 118/71 | HR 75 | Temp 99.4°F | Ht 67.0 in | Wt 214.8 lb

## 2017-08-21 DIAGNOSIS — J309 Allergic rhinitis, unspecified: Secondary | ICD-10-CM

## 2017-08-21 DIAGNOSIS — Z0000 Encounter for general adult medical examination without abnormal findings: Secondary | ICD-10-CM | POA: Diagnosis not present

## 2017-08-21 MED ORDER — BUPROPION HCL ER (XL) 300 MG PO TB24: 300.0000 mg | ORAL_TABLET | Freq: Every day | ORAL | 2 refills | Status: DC

## 2017-08-21 MED ORDER — OMEPRAZOLE 20 MG PO CPDR: 20.0000 mg | DELAYED_RELEASE_CAPSULE | Freq: Every day | ORAL | 3 refills | Status: DC

## 2017-08-21 MED ORDER — IPRATROPIUM BROMIDE 0.06 % NA SOLN: 2.0000 | Freq: Three times a day (TID) | NASAL | 12 refills | Status: DC

## 2017-08-21 MED ORDER — SERTRALINE HCL 50 MG PO TABS: ORAL_TABLET | ORAL | 3 refills | Status: DC

## 2017-08-22 LAB — CMP14+EGFR
ALBUMIN: 4.3 g/dL (ref 3.5–5.5)
ALT: 26 IU/L (ref 0–32)
AST: 23 IU/L (ref 0–40)
Albumin/Globulin Ratio: 2 (ref 1.2–2.2)
Alkaline Phosphatase: 51 IU/L (ref 39–117)
BILIRUBIN TOTAL: 0.3 mg/dL (ref 0.0–1.2)
BUN / CREAT RATIO: 14 (ref 9–23)
BUN: 12 mg/dL (ref 6–24)
CHLORIDE: 104 mmol/L (ref 96–106)
CO2: 24 mmol/L (ref 20–29)
Calcium: 9.6 mg/dL (ref 8.7–10.2)
Creatinine, Ser: 0.86 mg/dL (ref 0.57–1.00)
GFR, EST AFRICAN AMERICAN: 96 mL/min/{1.73_m2} (ref >59)
GFR, EST NON AFRICAN AMERICAN: 83 mL/min/{1.73_m2} (ref >59)
Globulin, Total: 2.2 g/dL (ref 1.5–4.5)
Glucose: 78 mg/dL (ref 65–99)
Potassium: 5.1 mmol/L (ref 3.5–5.2)
Sodium: 140 mmol/L (ref 134–144)
TOTAL PROTEIN: 6.5 g/dL (ref 6.0–8.5)

## 2017-08-22 LAB — LIPID PANEL
CHOL/HDL RATIO: 2.3 ratio (ref 0.0–4.4)
Cholesterol, Total: 191 mg/dL (ref 100–199)
HDL: 84 mg/dL (ref >39)
LDL CALC: 84 mg/dL (ref 0–99)
Triglycerides: 115 mg/dL (ref 0–149)
VLDL CHOLESTEROL CAL: 23 mg/dL (ref 5–40)

## 2017-08-22 LAB — CBC WITH DIFFERENTIAL/PLATELET

## 2017-08-22 LAB — TSH: TSH: 1.43 u[IU]/mL (ref 0.450–4.500)

## 2017-08-24 ENCOUNTER — Encounter: Payer: Self-pay | Admitting: Physician Assistant

## 2017-08-24 NOTE — Progress Notes (Signed)
BP 118/71   Pulse 75   Temp 99.4 F (37.4 C) (Oral)   Ht '5\' 7"'  (1.702 m)   Wt 214 lb 12.8 oz (97.4 kg)   BMI 33.64 kg/m    Subjective:    Patient ID: Carmen Stokes, female    DOB: January 01, 1976, 42 y.o.   MRN: [id number]  HPI: Carmen Stokes is a 42 y.o. female presenting on 08/21/2017 for New Patient (Initial Visit) and Annual Exam She is a new patient and here for her annual exam.  She does need a form completed for work.  We will give her reduced  right on her insurance.  She had a well exam last year.  I reviewed this in EPIC.  This time she is not having any significant problems.  All of her history is reviewed.  Her family history is also reviewed.  She would like to have fasting labs today.   Past Medical History:  Diagnosis Date  . Allergy   . Depression    Relevant past medical, surgical, family and social history reviewed and updated as indicated. Interim medical history since our last visit reviewed. Allergies and medications reviewed and updated. DATA REVIEWED: CHART IN EPIC  Family History reviewed for pertinent findings.  Review of Systems  Constitutional: Negative.  Negative for activity change, fatigue and fever.  HENT: Negative.   Eyes: Negative.   Respiratory: Negative.  Negative for cough.   Cardiovascular: Negative.  Negative for chest pain.  Gastrointestinal: Negative.  Negative for abdominal pain.  Endocrine: Negative.   Genitourinary: Negative.  Negative for dysuria.  Musculoskeletal: Negative.   Skin: Negative.   Neurological: Negative.     Allergies as of 08/21/2017   No Known Allergies     Medication List        Accurate as of 08/21/17 11:59 PM. Always use your most recent med list.          buPROPion 300 MG 24 hr tablet Commonly known as:  WELLBUTRIN XL Take 1 tablet (300 mg total) by mouth daily.   cetirizine 10 MG tablet Commonly known as:  ZYRTEC Take 10 mg by mouth daily.   ipratropium 0.06 % nasal spray Commonly known as:   ATROVENT Place 2 sprays into both nostrils 3 (three) times daily.   NORTREL 0.5/35 (28) 0.5-35 MG-MCG tablet Generic drug:  norethindrone-ethinyl estradiol TAKE 1 ACTIVE TABLET DAILY DO NOT TAKE PLACEBO PILL   omeprazole 20 MG capsule Commonly known as:  PRILOSEC Take 1 capsule (20 mg total) by mouth daily.   sertraline 50 MG tablet Commonly known as:  ZOLOFT TAKE 1 TABLET (50 MG TOTAL) BY MOUTH DAILY.          Objective:    BP 118/71   Pulse 75   Temp 99.4 F (37.4 C) (Oral)   Ht '5\' 7"'  (1.702 m)   Wt 214 lb 12.8 oz (97.4 kg)   BMI 33.64 kg/m   No Known Allergies  Wt Readings from Last 3 Encounters:  08/21/17 214 lb 12.8 oz (97.4 kg)  08/19/16 241 lb (109.3 kg)  05/31/15 219 lb (99.3 kg)    Physical Exam  Constitutional: She is oriented to person, place, and time. She appears well-developed and well-nourished.  HENT:  Head: Normocephalic and atraumatic.  Right Ear: Tympanic membrane, external ear and ear canal normal.  Left Ear: Tympanic membrane, external ear and ear canal normal.  Nose: Nose normal. No rhinorrhea.  Mouth/Throat: Oropharynx is clear and moist and mucous membranes  are normal. No oropharyngeal exudate or posterior oropharyngeal erythema.  Eyes: Pupils are equal, round, and reactive to light. Conjunctivae and EOM are normal.  Neck: Normal range of motion. Neck supple.  Cardiovascular: Normal rate, regular rhythm, normal heart sounds and intact distal pulses.  Pulmonary/Chest: Effort normal and breath sounds normal.  Abdominal: Soft. Bowel sounds are normal.  Neurological: She is alert and oriented to person, place, and time. She has normal reflexes.  Skin: Skin is warm and dry. No rash noted.  Psychiatric: She has a normal mood and affect. Her behavior is normal. Judgment and thought content normal.        Assessment & Plan:   1. Wellness examination - CBC with Differential/Platelet - CMP14+EGFR - Lipid panel - TSH  2. Allergic rhinitis,  unspecified seasonality, unspecified trigger   Continue all other maintenance medications as listed above.  Follow up plan: Return in about 1 month (around 09/18/2017) for recheck.  Educational handout given for [place] PA-C [place] [address]  [place], [place] [address] [phone]   08/24/2017, 1:46 PM

## 2017-09-30 ENCOUNTER — Ambulatory Visit: Payer: BLUE CROSS/BLUE SHIELD | Admitting: Physician Assistant

## 2017-10-02 ENCOUNTER — Encounter: Payer: Self-pay | Admitting: Physician Assistant

## 2017-10-02 ENCOUNTER — Ambulatory Visit (INDEPENDENT_AMBULATORY_CARE_PROVIDER_SITE_OTHER): Payer: BLUE CROSS/BLUE SHIELD | Admitting: Physician Assistant

## 2017-10-02 VITALS — BP 121/73 | HR 89 | Ht 67.0 in | Wt 215.4 lb

## 2017-10-02 DIAGNOSIS — F332 Major depressive disorder, recurrent severe without psychotic features: Secondary | ICD-10-CM | POA: Diagnosis not present

## 2017-10-02 DIAGNOSIS — R739 Hyperglycemia, unspecified: Secondary | ICD-10-CM

## 2017-10-02 NOTE — Progress Notes (Signed)
BP 121/73   Pulse 89   Ht 5\' 7"  (1.702 m)   Wt 215 lb 6.4 oz (97.7 kg)   BMI 33.74 kg/m    Subjective:    Patient ID: Carmen Stokes, female    DOB: 1975-07-27, 42 y.o.   MRN: [id number]  HPI: Carmen Stokes is a 42 y.o. female presenting on 10/02/2017 for Depression (1 month follow up )  This patient comes in for 1 month recheck on her depression.  We had her increase her Wellbutrin to 300 mg.  She is continue the Zoloft at 68.  She states that she can tell a difference in her depression.  She still has some decreased motivation.  She is really trying to work hard on losing weight but has had a lot of difficulty.  She states that she is hungry all the time.  She has never had difficulty with diabetes.  We had a long conversation about insulin resistance and what is being done to treat that.  We will draw labs today.  Depression screen [facility] 2/9 10/02/2017 08/21/2017 08/19/2016 03/05/2015 10/11/2013  Decreased Interest 2 3 1  0 1  Down, Depressed, Hopeless 1 2 0 0 1  PHQ - 2 Score 3 5 1  0 2  Altered sleeping 1 2 0 0 1  Tired, decreased energy 3 3 2  0 2  Change in appetite 2 3 0 0 1  Feeling bad or failure about yourself  0 1 0 0 1  Trouble concentrating 1 3 0 0 2  Moving slowly or fidgety/restless 0 1 0 0 0  Suicidal thoughts 0 0 0 0 1  PHQ-9 Score 10 18 3  0 10  Difficult doing work/chores - - Not difficult at all Not difficult at all -     Past Medical History:  Diagnosis Date  . Allergy   . Depression    Relevant past medical, surgical, family and social history reviewed and updated as indicated. Interim medical history since our last visit reviewed. Allergies and medications reviewed and updated. DATA REVIEWED: CHART IN EPIC  Family History reviewed for pertinent findings.  Review of Systems  Constitutional: Positive for unexpected weight change. Negative for activity change, fatigue and fever.  HENT: Negative.   Eyes: Negative.   Respiratory: Negative.  Negative for  cough.   Cardiovascular: Negative.  Negative for chest pain.  Gastrointestinal: Negative.  Negative for abdominal pain.  Endocrine: Negative.   Genitourinary: Negative.  Negative for dysuria.  Musculoskeletal: Negative.   Skin: Negative.   Neurological: Negative.     Allergies as of 10/02/2017   No Known Allergies     Medication List        Accurate as of 10/02/17  4:25 PM. Always use your most recent med list.          buPROPion 300 MG 24 hr tablet Commonly known as:  WELLBUTRIN XL Take 1 tablet (300 mg total) by mouth daily.   cetirizine 10 MG tablet Commonly known as:  ZYRTEC Take 10 mg by mouth daily.   NORTREL 0.5/35 (28) 0.5-35 MG-MCG tablet Generic drug:  norethindrone-ethinyl estradiol TAKE 1 ACTIVE TABLET DAILY DO NOT TAKE PLACEBO PILL   omeprazole 20 MG capsule Commonly known as:  PRILOSEC Take 1 capsule (20 mg total) by mouth daily.   sertraline 50 MG tablet Commonly known as:  ZOLOFT TAKE 1 TABLET (50 MG TOTAL) BY MOUTH DAILY.          Objective:  BP 121/73   Pulse 89   Ht 5\' 7"  (1.702 m)   Wt 215 lb 6.4 oz (97.7 kg)   BMI 33.74 kg/m   No Known Allergies  Wt Readings from Last 3 Encounters:  10/02/17 215 lb 6.4 oz (97.7 kg)  08/21/17 214 lb 12.8 oz (97.4 kg)  08/19/16 241 lb (109.3 kg)    Physical Exam  Constitutional: She is oriented to person, place, and time. She appears well-developed and well-nourished.  HENT:  Head: Normocephalic and atraumatic.  Eyes: Pupils are equal, round, and reactive to light. Conjunctivae and EOM are normal.  Cardiovascular: Normal rate, regular rhythm, normal heart sounds and intact distal pulses.  Pulmonary/Chest: Effort normal and breath sounds normal.  Abdominal: Soft. Bowel sounds are normal.  Neurological: She is alert and oriented to person, place, and time. She has normal reflexes.  Skin: Skin is warm and dry. No rash noted.  Psychiatric: She has a normal mood and affect. Her behavior is normal.  Judgment and thought content normal.        Assessment & Plan:   1. Hyperglycemia Consider metformin in the future - Insulin and C-Peptide  2. Severe episode of recurrent major depressive disorder, without psychotic features ([place]) Continue Wellbutrin Continue Zoloft    Continue all other maintenance medications as listed above.  Follow up plan: Return in about 6 months (around 04/03/2018) for recheck.  Educational handout given for [place] PA-C [place] [address]  [place], [place] [address] [phone]   10/02/2017, 4:25 PM

## 2017-10-03 LAB — INSULIN AND C-PEPTIDE, SERUM
C PEPTIDE: 3.2 ng/mL (ref 1.1–4.4)
INSULIN: 13.1 u[IU]/mL (ref 2.6–24.9)

## 2017-10-08 DIAGNOSIS — N814 Uterovaginal prolapse, unspecified: Secondary | ICD-10-CM | POA: Diagnosis not present

## 2017-10-08 DIAGNOSIS — Z01411 Encounter for gynecological examination (general) (routine) with abnormal findings: Secondary | ICD-10-CM | POA: Diagnosis not present

## 2017-10-08 DIAGNOSIS — Z6832 Body mass index (BMI) 32.0-32.9, adult: Secondary | ICD-10-CM | POA: Diagnosis not present

## 2017-11-04 ENCOUNTER — Encounter: Payer: Self-pay | Admitting: Physician Assistant

## 2017-11-04 ENCOUNTER — Encounter: Payer: BLUE CROSS/BLUE SHIELD | Admitting: Family Medicine

## 2017-11-04 ENCOUNTER — Ambulatory Visit (INDEPENDENT_AMBULATORY_CARE_PROVIDER_SITE_OTHER): Payer: BLUE CROSS/BLUE SHIELD | Admitting: Physician Assistant

## 2017-11-04 VITALS — BP 115/79 | HR 87 | Ht 67.0 in | Wt 211.8 lb

## 2017-11-04 DIAGNOSIS — R102 Pelvic and perineal pain: Secondary | ICD-10-CM

## 2017-11-04 DIAGNOSIS — N938 Other specified abnormal uterine and vaginal bleeding: Secondary | ICD-10-CM

## 2017-11-04 MED ORDER — ETHYNODIOL DIAC-ETH ESTRADIOL 1-50 MG-MCG PO TABS: 1.0000 | ORAL_TABLET | Freq: Every day | ORAL | 4 refills | Status: DC

## 2017-11-05 NOTE — Progress Notes (Signed)
BP 115/79   Pulse 87   Ht 5\' 7"  (1.702 m)   Wt 211 lb 12.8 oz (96.1 kg)   BMI 33.17 kg/m    Subjective:    Patient ID: Carmen Stokes, female    DOB: 1976-02-03, 42 y.o.   MRN: [id number]  HPI: Carmen Stokes is a 42 y.o. female presenting on 11/04/2017 for Breakthrough bleeding  (with clotting ) and Abdominal Cramping  This patient comes in because of severe breakthrough bleeding.  It has been going on for the past few months.  She has been taking her birth control on a continuous basis and had been doing very well up until the last few months.  She has severe abdominal cramping and even has pain with intercourse.  She had an exam just in the past couple of months, no Pap was necessary this year.  We have discussed changing her medications something with a little more hormonal control and planning to get a pelvic ultrasound.   Past Medical History:  Diagnosis Date  . Allergy   . Depression    Relevant past medical, surgical, family and social history reviewed and updated as indicated. Interim medical history since our last visit reviewed. Allergies and medications reviewed and updated. DATA REVIEWED: CHART IN EPIC  Family History reviewed for pertinent findings.  Review of Systems  Constitutional: Negative.  Negative for activity change, fatigue and fever.  HENT: Negative.   Eyes: Negative.   Respiratory: Negative.  Negative for cough.   Cardiovascular: Negative.  Negative for chest pain.  Gastrointestinal: Negative.  Negative for abdominal pain.  Endocrine: Negative.   Genitourinary: Positive for menstrual problem, pelvic pain, vaginal bleeding and vaginal pain. Negative for dysuria.  Musculoskeletal: Negative.   Skin: Negative.   Neurological: Negative.     Allergies as of 11/04/2017   No Known Allergies     Medication List        Accurate as of 11/04/17 11:59 PM. Always use your most recent med list.          buPROPion 300 MG 24 hr tablet Commonly known  as:  WELLBUTRIN XL Take 1 tablet (300 mg total) by mouth daily.   cetirizine 10 MG tablet Commonly known as:  ZYRTEC Take 10 mg by mouth daily.   ethynodiol-ethinyl estradiol 1-50 MG-MCG tablet Commonly known as:  ZOVIA Take 1 tablet by mouth daily. Do not take placebo   NORTREL 0.5/35 (28) 0.5-35 MG-MCG tablet Generic drug:  norethindrone-ethinyl estradiol TAKE 1 ACTIVE TABLET DAILY DO NOT TAKE PLACEBO PILL   omeprazole 20 MG capsule Commonly known as:  PRILOSEC Take 1 capsule (20 mg total) by mouth daily.   sertraline 50 MG tablet Commonly known as:  ZOLOFT TAKE 1 TABLET (50 MG TOTAL) BY MOUTH DAILY.          Objective:    BP 115/79   Pulse 87   Ht 5\' 7"  (1.702 m)   Wt 211 lb 12.8 oz (96.1 kg)   BMI 33.17 kg/m   No Known Allergies  Wt Readings from Last 3 Encounters:  11/04/17 211 lb 12.8 oz (96.1 kg)  10/02/17 215 lb 6.4 oz (97.7 kg)  08/21/17 214 lb 12.8 oz (97.4 kg)    Physical Exam  Constitutional: She is oriented to person, place, and time. She appears well-developed and well-nourished.  HENT:  Head: Normocephalic and atraumatic.  Eyes: Pupils are equal, round, and reactive to light. Conjunctivae and EOM are normal.  Cardiovascular: Normal rate, regular  rhythm, normal heart sounds and intact distal pulses.  Pulmonary/Chest: Effort normal and breath sounds normal.  Abdominal: Soft. Bowel sounds are normal.  Neurological: She is alert and oriented to person, place, and time. She has normal reflexes.  Skin: Skin is warm and dry. No rash noted.  Psychiatric: She has a normal mood and affect. Her behavior is normal. Judgment and thought content normal.    Results for orders placed or performed in visit on 10/02/17  Insulin and C-Peptide  Result Value Ref Range   INSULIN 13.1 2.6 - 24.9 uIU/mL   C-Peptide 3.2 1.1 - 4.4 ng/mL      Assessment & Plan:   1. Pelvic pain - US Pelvis Complete; Future  2. Dysfunctional uterine bleeding - US Pelvis  Complete; Future - ethynodiol-ethinyl estradiol (ZOVIA) 1-50 MG-MCG tablet; Take 1 tablet by mouth daily. Do not take placebo  Dispense: 3 Package; Refill: 4   Continue all other maintenance medications as listed above.  Follow up plan: Return in about 1 month (around 12/02/2017) for recheck.  Educational handout given for [place] PA-C [place] [address]  [place], [place] [address] [phone]   11/05/2017, 8:43 AM

## 2017-11-06 ENCOUNTER — Ambulatory Visit (HOSPITAL_COMMUNITY)
Admission: RE | Admit: 2017-11-06 | Discharge: 2017-11-06 | Disposition: A | Discharge status: Home / Self Care | Payer: BLUE CROSS/BLUE SHIELD | Source: Ambulatory Visit | Attending: Physician Assistant | Admitting: Physician Assistant

## 2017-11-06 ENCOUNTER — Other Ambulatory Visit: Payer: Self-pay | Admitting: Physician Assistant

## 2017-11-06 DIAGNOSIS — R102 Pelvic and perineal pain: Secondary | ICD-10-CM | POA: Insufficient documentation

## 2017-11-06 DIAGNOSIS — D251 Intramural leiomyoma of uterus: Secondary | ICD-10-CM | POA: Diagnosis not present

## 2017-11-06 DIAGNOSIS — N938 Other specified abnormal uterine and vaginal bleeding: Secondary | ICD-10-CM | POA: Diagnosis not present

## 2017-11-13 ENCOUNTER — Other Ambulatory Visit: Payer: Self-pay | Admitting: Physician Assistant

## 2017-11-16 ENCOUNTER — Telehealth: Payer: Self-pay | Admitting: Physician Assistant

## 2017-11-16 ENCOUNTER — Other Ambulatory Visit: Payer: Self-pay | Admitting: Physician Assistant

## 2017-11-16 MED ORDER — FLUCONAZOLE 150 MG PO TABS: ORAL_TABLET | ORAL | 0 refills | Status: DC

## 2017-11-16 NOTE — Telephone Encounter (Signed)
Pt aware rx sent to pharmacy.

## 2017-11-16 NOTE — Progress Notes (Signed)
diflucan 

## 2017-11-16 NOTE — Telephone Encounter (Signed)
Medication sent to [facility].

## 2017-12-10 ENCOUNTER — Ambulatory Visit: Payer: BLUE CROSS/BLUE SHIELD | Admitting: Physician Assistant

## 2017-12-29 ENCOUNTER — Ambulatory Visit (INDEPENDENT_AMBULATORY_CARE_PROVIDER_SITE_OTHER): Payer: BLUE CROSS/BLUE SHIELD | Admitting: Physician Assistant

## 2017-12-29 ENCOUNTER — Encounter: Payer: Self-pay | Admitting: Physician Assistant

## 2017-12-29 VITALS — BP 126/83 | HR 93 | Temp 99.5°F | Ht 67.0 in | Wt 216.0 lb

## 2017-12-29 DIAGNOSIS — Z6833 Body mass index (BMI) 33.0-33.9, adult: Secondary | ICD-10-CM | POA: Diagnosis not present

## 2017-12-29 DIAGNOSIS — R632 Polyphagia: Secondary | ICD-10-CM

## 2017-12-29 DIAGNOSIS — R5383 Other fatigue: Secondary | ICD-10-CM | POA: Diagnosis not present

## 2017-12-29 DIAGNOSIS — R635 Abnormal weight gain: Secondary | ICD-10-CM

## 2017-12-29 MED ORDER — LIRAGLUTIDE -WEIGHT MANAGEMENT 18 MG/3ML ~~LOC~~ SOPN: 3.0000 mg | PEN_INJECTOR | Freq: Every day | SUBCUTANEOUS | 1 refills | Status: DC

## 2017-12-29 NOTE — Progress Notes (Signed)
BP 126/83   Pulse 93   Temp 99.5 F (37.5 C) (Oral)   Ht 5\' 7"  (1.702 m)   Wt 216 lb (98 kg)   BMI 33.83 kg/m    Subjective:    Patient ID: Carmen Stokes, female    DOB: 1975/10/06, 42 y.o.   MRN: [id number]  HPI: Carmen Stokes is a 42 y.o. female presenting on 12/29/2017 for 1 month follow up on abnormal bleeding and pelvic pain  Patient reports that her cycles are very well controlled now on the oral contraceptive.  And also her Zoloft is helping her depression symptoms tremendously.  She would like to discuss today her weight gain.  She does have morbid obesity.  Years ago she would not be able to work on a diet and exercise and lose weight quite quickly.  At this point she is having a lot of difficulty losing any weight.  She states she is extremely tired and has had a greater weight gain in the past few years.  We will draw labs today to look for any medical reason.  Tried and failed on Weight watcher Low calorie Low carb Increased exercise  Past Medical History:  Diagnosis Date  . Allergy   . Depression    Relevant past medical, surgical, family and social history reviewed and updated as indicated. Interim medical history since our last visit reviewed. Allergies and medications reviewed and updated. DATA REVIEWED: CHART IN EPIC  Family History reviewed for pertinent findings.  Review of Systems  Constitutional: Negative.   HENT: Negative.   Eyes: Negative.   Respiratory: Negative.   Gastrointestinal: Negative.   Genitourinary: Negative.     Allergies as of 12/29/2017   No Known Allergies     Medication List        Accurate as of 12/29/17  4:34 PM. Always use your most recent med list.          buPROPion 300 MG 24 hr tablet Commonly known as:  WELLBUTRIN XL TAKE 1 TABLET BY MOUTH EVERY DAY   cetirizine 10 MG tablet Commonly known as:  ZYRTEC Take 10 mg by mouth daily.   ethynodiol-ethinyl estradiol 1-50 MG-MCG tablet Commonly known as:   ZOVIA Take 1 tablet by mouth daily. Do not take placebo   fluconazole 150 MG tablet Commonly known as:  DIFLUCAN 1 po q week x 4 weeks   Liraglutide -Weight Management 18 MG/3ML Sopn Inject 3 mg into the skin daily.   omeprazole 20 MG capsule Commonly known as:  PRILOSEC Take 1 capsule (20 mg total) by mouth daily.   sertraline 50 MG tablet Commonly known as:  ZOLOFT TAKE 1 TABLET (50 MG TOTAL) BY MOUTH DAILY.          Objective:    BP 126/83   Pulse 93   Temp 99.5 F (37.5 C) (Oral)   Ht 5\' 7"  (1.702 m)   Wt 216 lb (98 kg)   BMI 33.83 kg/m   No Known Allergies  Wt Readings from Last 3 Encounters:  12/29/17 216 lb (98 kg)  11/04/17 211 lb 12.8 oz (96.1 kg)  10/02/17 215 lb 6.4 oz (97.7 kg)    Physical Exam  Constitutional: She is oriented to person, place, and time. She appears well-developed and well-nourished.  HENT:  Head: Normocephalic and atraumatic.  Eyes: Pupils are equal, round, and reactive to light. Conjunctivae and EOM are normal.  Cardiovascular: Normal rate, regular rhythm, normal heart sounds and intact distal pulses.  Pulmonary/Chest: Effort normal and breath sounds normal.  Abdominal: Soft. Bowel sounds are normal.  Neurological: She is alert and oriented to person, place, and time. She has normal reflexes.  Skin: Skin is warm and dry. No rash noted.  Psychiatric: She has a normal mood and affect. Her behavior is normal. Judgment and thought content normal.    Results for orders placed or performed in visit on 10/02/17  Insulin and C-Peptide  Result Value Ref Range   INSULIN 13.1 2.6 - 24.9 uIU/mL   C-Peptide 3.2 1.1 - 4.4 ng/mL      Assessment & Plan:   1. Weight gain Reduced calorie, high protein diet  2. Other fatigue Walk daily  3. Appetite increase Walk daily  4. Body mass index 33.0-33.9, adult - Liraglutide -Weight Management (SAXENDA) 18 MG/3ML SOPN; Inject 3 mg into the skin daily.  Dispense: 5 pen; Refill: 1  5. Morbid  obesity ([facility]) - Liraglutide -Weight Management (SAXENDA) 18 MG/3ML SOPN; Inject 3 mg into the skin daily.  Dispense: 5 pen; Refill: 1   Continue all other maintenance medications as listed above.  Follow up plan: Return in about 4 weeks (around 01/26/2018) for recheck.  Educational handout given for 1500 calorie diet  Terald Sleeper PA-C [place] [address]  [place], [place] [address] [phone]   12/29/2017, 4:34 PM

## 2017-12-29 NOTE — Patient Instructions (Signed)
Breakfast: eggs 2-3 Or greek yogurt low fat DANNON 1 slice delightful [employer] bread  Lunch: 2 slice [employer] delightfully bread       Or Nature's Own Light 4 ounces chicken, turkey, roast beef 1 slice Thin sliced cheese [employer] Mustard ok 1 piece of fruit  Supper: 6 ounces lean meat 2 cups raw/cooked veg or 1 cup pintos, corn lima  3 snacks 100 calories or less  

## 2017-12-31 ENCOUNTER — Other Ambulatory Visit: Payer: Self-pay | Admitting: *Deleted

## 2017-12-31 MED ORDER — INSULIN PEN NEEDLE 32G X 4 MM MISC: 0 refills | Status: DC

## 2018-01-29 ENCOUNTER — Ambulatory Visit: Payer: BLUE CROSS/BLUE SHIELD | Admitting: Physician Assistant

## 2018-02-10 ENCOUNTER — Other Ambulatory Visit: Payer: Self-pay | Admitting: Physician Assistant

## 2018-02-10 NOTE — Telephone Encounter (Signed)
OV 10/02/17 rtc 6 mos

## 2018-02-24 ENCOUNTER — Other Ambulatory Visit: Payer: Self-pay | Admitting: Physician Assistant

## 2018-02-24 DIAGNOSIS — Z6833 Body mass index (BMI) 33.0-33.9, adult: Secondary | ICD-10-CM

## 2018-02-24 NOTE — Telephone Encounter (Signed)
Last seen 12/29/17  Carmen Stokes

## 2018-03-24 DIAGNOSIS — Z6833 Body mass index (BMI) 33.0-33.9, adult: Secondary | ICD-10-CM | POA: Diagnosis not present

## 2018-03-24 DIAGNOSIS — J0100 Acute maxillary sinusitis, unspecified: Secondary | ICD-10-CM | POA: Diagnosis not present

## 2018-03-24 DIAGNOSIS — J00 Acute nasopharyngitis [common cold]: Secondary | ICD-10-CM | POA: Diagnosis not present

## 2018-03-30 ENCOUNTER — Telehealth: Payer: Self-pay

## 2018-03-30 NOTE — Telephone Encounter (Signed)
Carmen Stokes is not a covered drug with her plan

## 2018-03-31 NOTE — Telephone Encounter (Signed)
Was supposed to have 4 weeks follow up from her September appointment.  She should make an appointment.

## 2018-03-31 NOTE — Telephone Encounter (Signed)
Pt aware and scheduled f/u with Particia Nearing 04/28/18 at 8:10.

## 2018-04-05 ENCOUNTER — Ambulatory Visit: Payer: BLUE CROSS/BLUE SHIELD | Admitting: Physician Assistant

## 2018-04-21 HISTORY — PX: SEPTOPLASTY: SUR1290

## 2018-04-28 ENCOUNTER — Encounter: Payer: Self-pay | Admitting: Physician Assistant

## 2018-04-28 ENCOUNTER — Ambulatory Visit (INDEPENDENT_AMBULATORY_CARE_PROVIDER_SITE_OTHER): Payer: BLUE CROSS/BLUE SHIELD | Admitting: Physician Assistant

## 2018-04-28 VITALS — BP 114/82 | HR 89 | Temp 98.2°F | Ht 67.0 in | Wt 219.2 lb

## 2018-04-28 DIAGNOSIS — R635 Abnormal weight gain: Secondary | ICD-10-CM | POA: Diagnosis not present

## 2018-04-28 MED ORDER — TOPIRAMATE 50 MG PO TABS: 50.0000 mg | ORAL_TABLET | Freq: Two times a day (BID) | ORAL | 1 refills | Status: DC

## 2018-05-01 NOTE — Progress Notes (Signed)
BP 114/82   Pulse 89   Temp 98.2 F (36.8 C) (Oral)   Ht 5\' 7"  (1.702 m)   Wt 219 lb 3.2 oz (99.4 kg)   BMI 34.33 kg/m    Subjective:    Patient ID: Carmen Stokes, female    DOB: 1975-12-09, 43 y.o.   MRN: [id number]  HPI: Carmen Stokes is a 43 y.o. female presenting on 04/28/2018 for Weight Gain (3 month )  , No The last this patient comes in for recheck on her chronic medical conditions.  She stopped the Saxenda because of side effects.  She is trying to eat better and has a good plan for her diet and exercise.  We have had a discussion about Topamax as a possibility.  She also does have migraines with this would be beneficial in preventing those.  But she is willing to start the medication we will plan to see her back in a few weeks.  Past Medical History:  Diagnosis Date  . Allergy   . Depression    Relevant past medical, surgical, family and social history reviewed and updated as indicated. Interim medical history since our last visit reviewed. Allergies and medications reviewed and updated. DATA REVIEWED: CHART IN EPIC  Family History reviewed for pertinent findings.  Review of Systems  Constitutional: Negative.   HENT: Negative.   Eyes: Negative.   Respiratory: Negative.   Gastrointestinal: Negative.   Genitourinary: Negative.     Allergies as of 04/28/2018   No Known Allergies     Medication List       Accurate as of April 28, 2018 11:59 PM. Always use your most recent med list.        buPROPion 300 MG 24 hr tablet Commonly known as:  WELLBUTRIN XL TAKE 1 TABLET BY MOUTH EVERY DAY   cetirizine 10 MG tablet Commonly known as:  ZYRTEC Take 10 mg by mouth daily.   ethynodiol-ethinyl estradiol 1-50 MG-MCG tablet Commonly known as:  ZOVIA Take 1 tablet by mouth daily. Do not take placebo   omeprazole 20 MG capsule Commonly known as:  PRILOSEC Take 1 capsule (20 mg total) by mouth daily.   sertraline 50 MG tablet Commonly known as:  ZOLOFT TAKE  1 TABLET (50 MG TOTAL) BY MOUTH DAILY.   topiramate 50 MG tablet Commonly known as:  TOPAMAX Take 1 tablet (50 mg total) by mouth 2 (two) times daily.          Objective:    BP 114/82   Pulse 89   Temp 98.2 F (36.8 C) (Oral)   Ht 5\' 7"  (1.702 m)   Wt 219 lb 3.2 oz (99.4 kg)   BMI 34.33 kg/m   No Known Allergies  Wt Readings from Last 3 Encounters:  04/28/18 219 lb 3.2 oz (99.4 kg)  12/29/17 216 lb (98 kg)  11/04/17 211 lb 12.8 oz (96.1 kg)    Physical Exam Constitutional:      Appearance: She is well-developed.  HENT:     Head: Normocephalic and atraumatic.  Eyes:     Conjunctiva/sclera: Conjunctivae normal.     Pupils: Pupils are equal, round, and reactive to light.  Cardiovascular:     Rate and Rhythm: Normal rate and regular rhythm.     Heart sounds: Normal heart sounds.  Pulmonary:     Effort: Pulmonary effort is normal.     Breath sounds: Normal breath sounds.  Abdominal:     General: Bowel sounds are normal.  Palpations: Abdomen is soft.  Skin:    General: Skin is warm and dry.     Findings: No rash.  Neurological:     Mental Status: She is alert and oriented to person, place, and time.     Deep Tendon Reflexes: Reflexes are normal and symmetric.  Psychiatric:        Behavior: Behavior normal.        Thought Content: Thought content normal.        Judgment: Judgment normal.     Results for orders placed or performed in visit on 10/02/17  Insulin and C-Peptide  Result Value Ref Range   INSULIN 13.1 2.6 - 24.9 uIU/mL   C-Peptide 3.2 1.1 - 4.4 ng/mL      Assessment & Plan:   1. Weight gain - topiramate (TOPAMAX) 50 MG tablet; Take 1 tablet (50 mg total) by mouth 2 (two) times daily.  Dispense: 60 tablet; Refill: 1   Continue all other maintenance medications as listed above.  Follow up plan: Return in about 2 months (around 06/27/2018).  Educational handout given for [place] PA-C [place] [address]  [place], [place] [address] [phone]   05/01/2018, 11:59 AM

## 2018-05-18 ENCOUNTER — Encounter: Payer: Self-pay | Admitting: Family Medicine

## 2018-05-18 ENCOUNTER — Encounter: Payer: Self-pay | Admitting: Physician Assistant

## 2018-05-18 ENCOUNTER — Ambulatory Visit: Payer: BLUE CROSS/BLUE SHIELD | Admitting: Family Medicine

## 2018-05-18 VITALS — BP 135/88 | HR 84 | Temp 99.1°F | Ht 67.0 in | Wt 214.0 lb

## 2018-05-18 DIAGNOSIS — N938 Other specified abnormal uterine and vaginal bleeding: Secondary | ICD-10-CM

## 2018-05-18 NOTE — Progress Notes (Signed)
Subjective:    Patient ID: Carmen Stokes, female    DOB: 02/02/1976, 43 y.o.   MRN: [id number]  Chief Complaint:  Referral to new gynecologist for new abnormal bleeding   HPI: Carmen Stokes is a 43 y.o. female presenting on 05/18/2018 for Referral to new gynecologist for new abnormal bleeding   1. Dysfunctional uterine bleeding   Pt presents today for abnormal menses. States this has been ongoing for several years but has increased over the last 2 weeks. States she is only taking the active pills of her birth control per her GYN. States over the last few weeks she is having more bleeding and clots. States she called her GYN and they did not make any changes to her treatment. Pt states the bleeding has slowed at this time. She does get intermittent headaches. She denies abdominal pain, dizziness, weakness, shortness of breath, or palpitations. States she did have on episode of dyspareunia. She denies vaginal discharge or dysuria.    Relevant past medical, surgical, family, and social history reviewed and updated as indicated.  Allergies and medications reviewed and updated.   Past Medical History:  Diagnosis Date  . Allergy   . Depression     History reviewed. No pertinent surgical history.  Social History   Socioeconomic History  . Marital status: Married    Spouse name: Not on file  . Number of children: Not on file  . Years of education: Not on file  . Highest education level: Not on file  Occupational History  . Not on file  Social Needs  . Financial resource strain: Not on file  . Food insecurity:    Worry: Not on file    Inability: Not on file  . Transportation needs:    Medical: Not on file    Non-medical: Not on file  Tobacco Use  . Smoking status: Former Smoker    Last attempt to quit: 04/22/2011    Years since quitting: 7.0  . Smokeless tobacco: Never Used  Substance and Sexual Activity  . Alcohol use: No  . Drug use: No  . Sexual activity: Yes   Birth control/protection: Pill  Lifestyle  . Physical activity:    Days per week: Not on file    Minutes per session: Not on file  . Stress: Not on file  Relationships  . Social connections:    Talks on phone: Not on file    Gets together: Not on file    Attends religious service: Not on file    Active member of club or organization: Not on file    Attends meetings of clubs or organizations: Not on file    Relationship status: Not on file  . Intimate partner violence:    Fear of current or ex partner: Not on file    Emotionally abused: Not on file    Physically abused: Not on file    Forced sexual activity: Not on file  Other Topics Concern  . Not on file  Social History Narrative  . Not on file    Outpatient Encounter Medications as of 05/18/2018  Medication Sig  . buPROPion (WELLBUTRIN XL) 300 MG 24 hr tablet TAKE 1 TABLET BY MOUTH EVERY DAY  . cetirizine (ZYRTEC) 10 MG tablet Take 10 mg by mouth daily.  Marland Kitchen ethynodiol-ethinyl estradiol (ZOVIA) 1-50 MG-MCG tablet Take 1 tablet by mouth daily. Do not take placebo  . omeprazole (PRILOSEC) 20 MG capsule Take 1 capsule (20 mg total) by mouth  daily.  . sertraline (ZOLOFT) 50 MG tablet TAKE 1 TABLET (50 MG TOTAL) BY MOUTH DAILY.  Marland Kitchen topiramate (TOPAMAX) 50 MG tablet Take 1 tablet (50 mg total) by mouth 2 (two) times daily.   No facility-administered encounter medications on file as of 05/18/2018.     No Known Allergies  Review of Systems  Constitutional: Negative for chills, fatigue and fever.  Respiratory: Negative for chest tightness and shortness of breath.   Cardiovascular: Negative for chest pain, palpitations and leg swelling.  Gastrointestinal: Negative for abdominal distention, abdominal pain, anal bleeding, blood in stool, constipation, diarrhea, nausea and vomiting.  Genitourinary: Positive for dyspareunia, menstrual problem and vaginal bleeding. Negative for decreased urine volume, difficulty urinating, flank pain,  genital sores, hematuria, pelvic pain, vaginal discharge and vaginal pain.  Skin: Negative for color change and pallor.  Neurological: Negative for weakness.  All other systems reviewed and are negative.       Objective:    BP 135/88   Pulse 84   Temp 99.1 F (37.3 C) (Oral)   Ht 5\' 7"  (1.702 m)   Wt 214 lb (97.1 kg)   BMI 33.52 kg/m    Wt Readings from Last 3 Encounters:  05/18/18 214 lb (97.1 kg)  04/28/18 219 lb 3.2 oz (99.4 kg)  12/29/17 216 lb (98 kg)    Physical Exam Vitals signs and nursing note reviewed.  Constitutional:      General: She is not in acute distress.    Appearance: Normal appearance. She is not ill-appearing or toxic-appearing.  HENT:     Head: Normocephalic and atraumatic.     Mouth/Throat:     Mouth: Mucous membranes are moist.     Pharynx: Oropharynx is clear.  Eyes:     Conjunctiva/sclera: Conjunctivae normal.     Pupils: Pupils are equal, round, and reactive to light.  Cardiovascular:     Rate and Rhythm: Normal rate and regular rhythm.     Heart sounds: Normal heart sounds. No murmur. No friction rub. No gallop.   Pulmonary:     Effort: Pulmonary effort is normal.     Breath sounds: Normal breath sounds.  Abdominal:     General: Bowel sounds are normal.     Palpations: Abdomen is soft.     Tenderness: There is no abdominal tenderness.  Skin:    General: Skin is warm and dry.     Capillary Refill: Capillary refill takes less than 2 seconds.     Coloration: Skin is not pale.  Neurological:     General: No focal deficit present.     Mental Status: She is alert and oriented to person, place, and time.  Psychiatric:        Mood and Affect: Mood normal.        Behavior: Behavior normal.        Thought Content: Thought content normal.        Judgment: Judgment normal.     Results for orders placed or performed in visit on 10/02/17  Insulin and C-Peptide  Result Value Ref Range   INSULIN 13.1 2.6 - 24.9 uIU/mL   C-Peptide 3.2 1.1  - 4.4 ng/mL       Pertinent labs & imaging results that were available during my care of the patient were reviewed by me and considered in my medical decision making.  Assessment & Plan:  Carmen Stokes was seen today for referral to new gynecologist for new abnormal bleeding.  Diagnoses and all orders for  this visit:  Dysfunctional uterine bleeding Requested referral for new GYN. Ongoing DUB. Breakthrough bleeding during active pills. Referral made.  -     Ambulatory referral to Obstetrics / Gynecology     Continue all other maintenance medications.  Follow up plan: Return if symptoms worsen or fail to improve.  Educational handout given for DUB  The above assessment and management plan was discussed with the patient. The patient verbalized understanding of and has agreed to the management plan. Patient is aware to call the clinic if symptoms persist or worsen. Patient is aware when to return to the clinic for a follow-up visit. Patient educated on when it is appropriate to go to the emergency department.   Monia Pouch, FNP-C [facility] [phone]

## 2018-05-18 NOTE — Patient Instructions (Signed)
Abnormal Uterine Bleeding  Abnormal uterine bleeding means bleeding more than usual from your uterus. It can include:   Bleeding between periods.   Bleeding after sex.   Bleeding that is heavier than normal.   Periods that last longer than usual.   Bleeding after you have stopped having your period (menopause).  There are many problems that may cause this. You should see a doctor for any kind of bleeding that is not normal. Treatment depends on the cause of the bleeding.  Follow these instructions at home:   Watch your condition for any changes.   Do not use tampons, douche, or have sex, if your doctor tells you not to.   Change your pads often.   Get regular well-woman exams. Make sure they include a pelvic exam and cervical cancer screening.   Keep all follow-up visits as told by your doctor. This is important.  Contact a doctor if:   The bleeding lasts more than one week.   You feel dizzy at times.   You feel like you are going to throw up (nauseous).   You throw up.  Get help right away if:   You pass out.   You have to change pads every hour.   You have belly (abdominal) pain.   You have a fever.   You get sweaty.   You get weak.   You passing large blood clots from your vagina.  Summary   Abnormal uterine bleeding means bleeding more than usual from your uterus.   There are many problems that may cause this. You should see a doctor for any kind of bleeding that is not normal.   Treatment depends on the cause of the bleeding.  This information is not intended to replace advice given to you by your health care provider. Make sure you discuss any questions you have with your health care provider.  Document Released: 02/02/2009 Document Revised: 04/01/2016 Document Reviewed: 04/01/2016  Elsevier Interactive Patient Education  2019 [employer]

## 2018-05-19 ENCOUNTER — Other Ambulatory Visit: Payer: Self-pay | Admitting: Physician Assistant

## 2018-05-21 ENCOUNTER — Other Ambulatory Visit: Payer: Self-pay | Admitting: Physician Assistant

## 2018-05-21 DIAGNOSIS — R635 Abnormal weight gain: Secondary | ICD-10-CM

## 2018-06-21 DIAGNOSIS — Z01419 Encounter for gynecological examination (general) (routine) without abnormal findings: Secondary | ICD-10-CM | POA: Diagnosis not present

## 2018-06-21 DIAGNOSIS — Z6832 Body mass index (BMI) 32.0-32.9, adult: Secondary | ICD-10-CM | POA: Diagnosis not present

## 2018-07-23 DIAGNOSIS — N926 Irregular menstruation, unspecified: Secondary | ICD-10-CM | POA: Diagnosis not present

## 2018-07-23 DIAGNOSIS — N939 Abnormal uterine and vaginal bleeding, unspecified: Secondary | ICD-10-CM | POA: Diagnosis not present

## 2018-07-28 ENCOUNTER — Ambulatory Visit: Payer: BLUE CROSS/BLUE SHIELD | Admitting: Physician Assistant

## 2018-07-28 ENCOUNTER — Other Ambulatory Visit: Payer: Self-pay

## 2018-07-28 ENCOUNTER — Ambulatory Visit (INDEPENDENT_AMBULATORY_CARE_PROVIDER_SITE_OTHER): Payer: BLUE CROSS/BLUE SHIELD | Admitting: Physician Assistant

## 2018-07-28 NOTE — Progress Notes (Signed)
Did not need appointment

## 2018-08-09 ENCOUNTER — Other Ambulatory Visit: Payer: Self-pay | Admitting: Physician Assistant

## 2018-08-29 ENCOUNTER — Encounter: Payer: Self-pay | Admitting: Physician Assistant

## 2018-08-30 ENCOUNTER — Other Ambulatory Visit: Payer: Self-pay | Admitting: Physician Assistant

## 2018-08-30 ENCOUNTER — Encounter: Payer: Self-pay | Admitting: Family

## 2018-08-30 ENCOUNTER — Ambulatory Visit (INDEPENDENT_AMBULATORY_CARE_PROVIDER_SITE_OTHER): Payer: BLUE CROSS/BLUE SHIELD | Admitting: Family

## 2018-08-30 ENCOUNTER — Telehealth: Payer: BLUE CROSS/BLUE SHIELD | Admitting: Physician Assistant

## 2018-08-30 ENCOUNTER — Other Ambulatory Visit: Payer: Self-pay

## 2018-08-30 DIAGNOSIS — R0602 Shortness of breath: Secondary | ICD-10-CM | POA: Diagnosis not present

## 2018-08-30 DIAGNOSIS — J029 Acute pharyngitis, unspecified: Secondary | ICD-10-CM | POA: Diagnosis not present

## 2018-08-30 DIAGNOSIS — R6889 Other general symptoms and signs: Secondary | ICD-10-CM | POA: Diagnosis not present

## 2018-08-30 DIAGNOSIS — Z20822 Contact with and (suspected) exposure to covid-19: Secondary | ICD-10-CM

## 2018-08-30 DIAGNOSIS — R0789 Other chest pain: Secondary | ICD-10-CM | POA: Diagnosis not present

## 2018-08-30 DIAGNOSIS — R059 Cough, unspecified: Secondary | ICD-10-CM

## 2018-08-30 DIAGNOSIS — R05 Cough: Secondary | ICD-10-CM

## 2018-08-30 MED ORDER — ALBUTEROL SULFATE HFA 108 (90 BASE) MCG/ACT IN AERS: 2.0000 | INHALATION_SPRAY | RESPIRATORY_TRACT | 0 refills | Status: DC | PRN

## 2018-08-30 MED ORDER — BENZONATATE 100 MG PO CAPS: 100.0000 mg | ORAL_CAPSULE | Freq: Two times a day (BID) | ORAL | 0 refills | Status: DC | PRN

## 2018-08-30 NOTE — Progress Notes (Signed)
   Virtual Visit via telephone Note  I connected with Carmen Stokes on 08/30/18 at 10:58 Am by telephone and verified that I am speaking with the correct person using two identifiers. Carmen Stokes is currently located at home  and no one  is currently with her during visit. The provider, Evelina Dun, FNP is located in their office at time of visit.  I discussed the limitations, risks, security and privacy concerns of performing an evaluation and management service by telephone and the availability of in person appointments. I also discussed with the patient that there may be a patient responsible charge related to this service. The patient expressed understanding and agreed to proceed.   History and Present Illness:  Pt calls the office today with SOB and chest tightness that started about a week ago. She completed an Evisit this morning and was prescribed albuterol and tessalon. She has not started these medications yet.  Shortness of Breath  This is a new problem. The current episode started 1 to 4 weeks ago. The problem occurs intermittently. The problem has been unchanged. Associated symptoms include chest pain (tightness) and a sore throat (mild). Pertinent negatives include no ear pain, fever, headaches, rhinorrhea, vomiting or wheezing. The symptoms are aggravated by pollens. Associated symptoms comments: Fatigue, intermittent nonproductive cough .      Review of Systems  Constitutional: Negative for fever.  HENT: Positive for sore throat (mild). Negative for ear pain and rhinorrhea.   Respiratory: Positive for shortness of breath. Negative for wheezing.   Cardiovascular: Positive for chest pain (tightness).  Gastrointestinal: Negative for vomiting.  Neurological: Negative for headaches.  All other systems reviewed and are negative.    Observations/Objective: No SOB or distress noted, constant nonproductive cough  Assessment and Plan: 1. Suspected Covid-19 Virus  Infection Start the albuterol and tessalon as needed  Discussed red flags that she would need to go to ED or follow up for Note given for work for total 14 days  Will enroll her in the home monitoring program - MyChart COVID-19 home monitoring program; Future    I discussed the assessment and treatment plan with the patient. The patient was provided an opportunity to ask questions and all were answered. The patient agreed with the plan and demonstrated an understanding of the instructions.   The patient was advised to call back or seek an in-person evaluation if the symptoms worsen or if the condition fails to improve as anticipated.  The above assessment and management plan was discussed with the patient. The patient verbalized understanding of and has agreed to the management plan. Patient is aware to call the clinic if symptoms persist or worsen. Patient is aware when to return to the clinic for a follow-up visit. Patient educated on when it is appropriate to go to the emergency department.   Time call ended:  11:13 Am  I provided 15 minutes of non-face-to-face time during this encounter.    Evelina Dun, FNP

## 2018-08-30 NOTE — Progress Notes (Signed)
E-Visit for Corona Virus Screening  Based on your current symptoms, you may very well have the virus, however your symptoms are mild. Currently, not all patients are being tested. If the symptoms are mild and there is not a known exposure, performing the test is not indicated.  You have been enrolled in [facility] for COVID-19. Daily you will receive a questionnaire within the [facility] website. Our COVID-19 response team will be monitoring your responses daily.  [name],  Your symptoms of cough and shortness of breath are typical of Covid. I will provide the prescribed medicine and provide a work note for the next 14 days. Your symptoms are less likely allergies as you have been taking allergy medications with no improvement and allergies don't generally cause shortness of breath.  Coronavirus disease 2019 (COVID-19) is a respiratory illness that can spread from person to person. The virus that causes COVID-19 is a new virus that was first identified in the country of [place] but is now found in multiple other countries and has spread to the [place].  Symptoms associated with the virus are mild to severe fever, cough, and shortness of breath. There is currently no vaccine to protect against COVID-19, and there is no specific antiviral treatment for the virus.   To be considered HIGH RISK for Coronavirus (COVID-19), you have to meet the following criteria:  . Traveled to [place], [place], [place], [place] or [place]; or in the [place] to [place], [place], [place], or [place]; and have fever, cough, and shortness of breath within the last 2 weeks of travel OR  . Been in close contact with a person diagnosed with COVID-19 within the last 2 weeks and have fever, cough, and shortness of breath  . IF YOU DO NOT MEET THESE CRITERIA, YOU ARE CONSIDERED LOW RISK FOR COVID-19.   It is vitally important that if you feel that you have an infection such as this virus or any other  virus that you stay home and away from places where you may spread it to others.  You should self-quarantine for 14 days if you have symptoms that could potentially be coronavirus and avoid contact with people age 45 and older.   You can use medication such as A prescription cough medication called Tessalon Perles 100 mg. You may take 1-2 capsules every 8 hours as needed for cough and A prescription inhaler called Albuterol MDI 90 mcg /actuation 2 puffs every 4 hours as needed for shortness of breath, wheezing, cough  You may also take acetaminophen (Tylenol) as needed for fever.   Reduce your risk of any infection by using the same precautions used for avoiding the common cold or flu:  Marland Kitchen Wash your hands often with soap and warm water for at least 20 seconds.  If soap and water are not readily available, use an alcohol-based hand sanitizer with at least 60% alcohol.  . If coughing or sneezing, cover your mouth and nose by coughing or sneezing into the elbow areas of your shirt or coat, into a tissue or into your sleeve (not your hands). . Avoid shaking hands with others and consider head nods or verbal greetings only. . Avoid touching your eyes, nose, or mouth with unwashed hands.  . Avoid close contact with people who are sick. . Avoid places or events with large numbers of people in one location, like concerts or sporting events. . Carefully consider travel plans you have or are making. . If  you are planning any travel outside or inside the [place], visit the CDC's Travelers' Health webpage for the latest health notices. . If you have some symptoms but not all symptoms, continue to monitor at home and seek medical attention if your symptoms worsen. . If you are having a medical emergency, call 911.  HOME CARE . Only take medications as instructed by your medical team. . Drink plenty of fluids and get plenty of rest. . A steam or ultrasonic humidifier can help if you have congestion.   GET HELP  RIGHT AWAY IF: . You develop worsening fever. . You become short of breath . You cough up blood. . Your symptoms become more severe MAKE SURE YOU   Understand these instructions.  Will watch your condition.  Will get help right away if you are not doing well or get worse.  Your e-visit answers were reviewed by a board certified advanced clinical practitioner to complete your personal care plan.  Depending on the condition, your plan could have included both over the counter or prescription medications.  If there is a problem please reply once you have received a response from your provider. Your safety is important to [place].  If you have drug allergies check your prescription carefully.    You can use MyChart to ask questions about today's visit, request a non-urgent call back, or ask for a work or school excuse for 24 hours related to this e-Visit. If it has been greater than 24 hours you will need to follow up with your provider, or enter a new e-Visit to address those concerns. You will get an e-mail in the next two days asking about your experience.  I hope that your e-visit has been valuable and will speed your recovery. Thank you for using e-visits.   I have spent 7 min in completion and review of this note- Lacy Duverney [facility]

## 2018-08-31 ENCOUNTER — Encounter (INDEPENDENT_AMBULATORY_CARE_PROVIDER_SITE_OTHER): Payer: Self-pay

## 2018-09-01 ENCOUNTER — Telehealth: Payer: Self-pay | Admitting: *Deleted

## 2018-09-01 NOTE — Telephone Encounter (Signed)
Attempted to contact pt in regards to my chart companion response of worsening appetite and weakness; left message on voicemail [phone]

## 2018-09-01 NOTE — Telephone Encounter (Signed)
Pt called back and states that she had an episode of nausea this morning; she did eat breakfast and took her medication; she now feels better; pt encouraged to remain hydrated by having 8-10 8 oz glasses of liquids, jello, popsicles; to keep her urine clear; pt also encouraged to eat small meals throughout the day as opposed to 3 large meals; pt also instructed to contact PCP for further recommendations; she verbalizes understanding; she normally sees Particia Nearing, [place]; will route to provider for notification.

## 2018-09-01 NOTE — Telephone Encounter (Signed)
Noted  

## 2018-09-01 NOTE — Telephone Encounter (Signed)
Attempted to contact pt; left message on voicemail. 

## 2018-09-05 ENCOUNTER — Encounter (INDEPENDENT_AMBULATORY_CARE_PROVIDER_SITE_OTHER): Payer: Self-pay

## 2018-09-06 ENCOUNTER — Encounter: Payer: Self-pay | Admitting: Physician Assistant

## 2018-09-06 ENCOUNTER — Other Ambulatory Visit: Payer: Self-pay | Admitting: Family

## 2018-09-09 ENCOUNTER — Other Ambulatory Visit: Payer: Self-pay | Admitting: Physician Assistant

## 2018-09-09 DIAGNOSIS — N938 Other specified abnormal uterine and vaginal bleeding: Secondary | ICD-10-CM

## 2018-09-20 ENCOUNTER — Encounter: Payer: Self-pay | Admitting: Physician Assistant

## 2018-09-21 ENCOUNTER — Other Ambulatory Visit: Payer: Self-pay | Admitting: Physician Assistant

## 2018-09-21 DIAGNOSIS — R0609 Other forms of dyspnea: Secondary | ICD-10-CM

## 2018-09-22 ENCOUNTER — Other Ambulatory Visit: Payer: Self-pay | Admitting: Physician Assistant

## 2018-10-25 DIAGNOSIS — Z1231 Encounter for screening mammogram for malignant neoplasm of breast: Secondary | ICD-10-CM | POA: Diagnosis not present

## 2018-10-27 ENCOUNTER — Other Ambulatory Visit: Payer: Self-pay | Admitting: Obstetrics & Gynecology

## 2018-10-27 ENCOUNTER — Other Ambulatory Visit: Payer: Self-pay | Admitting: Physician Assistant

## 2018-10-27 DIAGNOSIS — R928 Other abnormal and inconclusive findings on diagnostic imaging of breast: Secondary | ICD-10-CM

## 2018-11-04 ENCOUNTER — Other Ambulatory Visit: Payer: Self-pay

## 2018-11-04 ENCOUNTER — Ambulatory Visit
Admission: RE | Admit: 2018-11-04 | Discharge: 2018-11-04 | Disposition: A | Discharge status: Home / Self Care | Payer: BC Managed Care – PPO | Source: Ambulatory Visit | Attending: Obstetrics & Gynecology | Admitting: Obstetrics & Gynecology

## 2018-11-04 ENCOUNTER — Ambulatory Visit: Payer: BLUE CROSS/BLUE SHIELD

## 2018-11-04 DIAGNOSIS — R928 Other abnormal and inconclusive findings on diagnostic imaging of breast: Secondary | ICD-10-CM

## 2018-11-08 ENCOUNTER — Other Ambulatory Visit: Payer: Self-pay | Admitting: Physician Assistant

## 2018-11-23 ENCOUNTER — Other Ambulatory Visit: Payer: Self-pay | Admitting: Physician Assistant

## 2018-11-23 DIAGNOSIS — N938 Other specified abnormal uterine and vaginal bleeding: Secondary | ICD-10-CM

## 2018-11-29 ENCOUNTER — Other Ambulatory Visit (HOSPITAL_COMMUNITY)
Admission: RE | Admit: 2018-11-29 | Discharge: 2018-11-29 | Disposition: A | Discharge status: Home / Self Care | Payer: BC Managed Care – PPO | Source: Ambulatory Visit | Attending: Obstetrics & Gynecology | Admitting: Obstetrics & Gynecology

## 2018-11-29 DIAGNOSIS — N920 Excessive and frequent menstruation with regular cycle: Secondary | ICD-10-CM | POA: Diagnosis not present

## 2018-11-29 DIAGNOSIS — Z20828 Contact with and (suspected) exposure to other viral communicable diseases: Secondary | ICD-10-CM | POA: Diagnosis not present

## 2018-11-29 DIAGNOSIS — Z01812 Encounter for preprocedural laboratory examination: Secondary | ICD-10-CM | POA: Insufficient documentation

## 2018-11-29 DIAGNOSIS — N939 Abnormal uterine and vaginal bleeding, unspecified: Secondary | ICD-10-CM | POA: Diagnosis not present

## 2018-11-29 LAB — SARS CORONAVIRUS 2 (TAT 6-24 HRS): SARS Coronavirus 2: NEGATIVE

## 2018-11-30 ENCOUNTER — Other Ambulatory Visit: Payer: Self-pay

## 2018-11-30 ENCOUNTER — Encounter (HOSPITAL_BASED_OUTPATIENT_CLINIC_OR_DEPARTMENT_OTHER): Payer: Self-pay | Admitting: *Deleted

## 2018-11-30 NOTE — Progress Notes (Signed)
Spoke with patient via telephone for pre op interview. NPO after MN. Patient to take Wellbutrin, Zoloft and Prilosec AM of surgery with a sip of water. Will need CBC, T&S and UPT AM of surgery. Arrival time 0530.

## 2018-12-01 ENCOUNTER — Encounter (HOSPITAL_BASED_OUTPATIENT_CLINIC_OR_DEPARTMENT_OTHER): Payer: Self-pay | Admitting: Anesthesiology

## 2018-12-01 NOTE — Anesthesia Preprocedure Evaluation (Addendum)
Anesthesia Evaluation  Patient identified by MRN, date of birth, ID band Patient awake    Reviewed: Allergy & Precautions, NPO status , Patient's Chart, lab work & pertinent test results  Airway Mallampati: I       Dental no notable dental hx. (+) Teeth Intact   Pulmonary sleep apnea , former smoker,    Pulmonary exam normal breath sounds clear to auscultation       Cardiovascular negative cardio ROS Normal cardiovascular exam Rhythm:Regular Rate:Normal     Neuro/Psych PSYCHIATRIC DISORDERS Anxiety Depression negative neurological ROS     GI/Hepatic Neg liver ROS, GERD  Medicated and Controlled,  Endo/Other  negative endocrine ROS  Renal/GU negative Renal ROS  negative genitourinary   Musculoskeletal negative musculoskeletal ROS (+)   Abdominal (+) + obese,   Peds  Hematology negative hematology ROS (+)   Anesthesia Other Findings   Reproductive/Obstetrics negative OB ROS                            Anesthesia Physical Anesthesia Plan  ASA: II  Anesthesia Plan: General   Post-op Pain Management:    Induction: Intravenous  PONV Risk Score and Plan: 4 or greater and Ondansetron, Dexamethasone, Midazolam and Scopolamine patch - Pre-op  Airway Management Planned: Oral ETT  Additional Equipment: None  Intra-op Plan:   Post-operative Plan: Extubation in OR  Informed Consent: I have reviewed the patients History and Physical, chart, labs and discussed the procedure including the risks, benefits and alternatives for the proposed anesthesia with the patient or authorized representative who has indicated his/her understanding and acceptance.       Plan Discussed with: CRNA  Anesthesia Plan Comments:        Anesthesia Quick Evaluation

## 2018-12-01 NOTE — H&P (Signed)
Carmen Stokes is an 43 y.o. female with HMB poorly controlled with hormonal medication (OCPs).  Patient declines continued hormonal tx such as IUD.  U/S shows 65 cc uterine volume with two small intramural fibroids (larges 1.4 cm).    Pertinent Gynecological History: Menses: flow is excessive with use of 8 pads or tampons on heaviest days Bleeding: HMB  Contraception: OCP (estrogen/progesterone) DES exposure: unknown Blood transfusions: none Sexually transmitted diseases: no past history Previous GYN Procedures: n/a  Last mammogram: normal Date: 10/2018 Last pap: normal Date: 06/2018 OB History: G0  Menstrual History: Menarche age: n/a No LMP recorded. (Menstrual status: Oral contraceptives).    Past Medical History:  Diagnosis Date  . Allergy   . Anxiety   . Depression   . GERD (gastroesophageal reflux disease)     Past Surgical History:  Procedure Laterality Date  . SEPTOPLASTY  2020  . WISDOM TOOTH EXTRACTION      Family History  Problem Relation Age of Onset  . Arthritis Mother   . Cancer Mother   . Thyroid disease Mother   . Depression Father   . Hyperlipidemia Brother   . Hypertension Brother   . Thyroid disease Maternal Aunt     Social History:  reports that she quit smoking about 7 years ago. She has never used smokeless tobacco. She reports that she does not drink alcohol or use drugs.  Allergies: No Known Allergies  No medications prior to admission.    ROS  Height 5\' 8"  (1.727 m), weight 95.3 kg. Physical Exam  Constitutional: She is oriented to person, place, and time. She appears well-developed and well-nourished.  GI: Soft. There is no rebound and no guarding.  Neurological: She is alert and oriented to person, place, and time.  Skin: Skin is warm and dry.  Psychiatric: She has a normal mood and affect. Her behavior is normal.    No results found for this or any previous visit (from the past 24 hour(s)).  No results  found.  Assessment/Plan: 43yo G0 with HMB -H/S, D&C, ablation, L/S BTL -Patient is counseled re: risk of bleeding, infection, scarring, and damage to surrounding structures.  She understands 50% of women who undergo ablation will require hysterectomy.  She understands her uterus will be inaccessible for bx or retreatment.  She also understands this is not a treatment for pelvic pain.  All questions were answered and the patient wishes to proceed.  Carmen Stokes 12/01/2018, 3:26 PM

## 2018-12-02 ENCOUNTER — Ambulatory Visit (HOSPITAL_BASED_OUTPATIENT_CLINIC_OR_DEPARTMENT_OTHER): Payer: BC Managed Care – PPO | Admitting: Anesthesiology

## 2018-12-02 ENCOUNTER — Encounter (HOSPITAL_BASED_OUTPATIENT_CLINIC_OR_DEPARTMENT_OTHER): Payer: Self-pay | Admitting: Emergency Medicine

## 2018-12-02 ENCOUNTER — Ambulatory Visit (HOSPITAL_BASED_OUTPATIENT_CLINIC_OR_DEPARTMENT_OTHER)
Admission: RE | Admit: 2018-12-02 | Discharge: 2018-12-02 | Disposition: A | Discharge status: Home / Self Care | Payer: BC Managed Care – PPO | Attending: Obstetrics & Gynecology | Admitting: Obstetrics & Gynecology

## 2018-12-02 ENCOUNTER — Encounter (HOSPITAL_BASED_OUTPATIENT_CLINIC_OR_DEPARTMENT_OTHER)
Admission: RE | Disposition: A | Discharge status: Home / Self Care | Payer: Self-pay | Source: Home / Self Care | Attending: Obstetrics & Gynecology

## 2018-12-02 ENCOUNTER — Other Ambulatory Visit: Payer: Self-pay

## 2018-12-02 DIAGNOSIS — F329 Major depressive disorder, single episode, unspecified: Secondary | ICD-10-CM | POA: Diagnosis not present

## 2018-12-02 DIAGNOSIS — D251 Intramural leiomyoma of uterus: Secondary | ICD-10-CM | POA: Diagnosis not present

## 2018-12-02 DIAGNOSIS — G4730 Sleep apnea, unspecified: Secondary | ICD-10-CM | POA: Diagnosis not present

## 2018-12-02 DIAGNOSIS — Z302 Encounter for sterilization: Secondary | ICD-10-CM | POA: Diagnosis not present

## 2018-12-02 DIAGNOSIS — F418 Other specified anxiety disorders: Secondary | ICD-10-CM | POA: Diagnosis not present

## 2018-12-02 DIAGNOSIS — N920 Excessive and frequent menstruation with regular cycle: Secondary | ICD-10-CM | POA: Insufficient documentation

## 2018-12-02 DIAGNOSIS — K219 Gastro-esophageal reflux disease without esophagitis: Secondary | ICD-10-CM | POA: Diagnosis not present

## 2018-12-02 DIAGNOSIS — Z87891 Personal history of nicotine dependence: Secondary | ICD-10-CM | POA: Insufficient documentation

## 2018-12-02 DIAGNOSIS — F419 Anxiety disorder, unspecified: Secondary | ICD-10-CM | POA: Insufficient documentation

## 2018-12-02 DIAGNOSIS — N938 Other specified abnormal uterine and vaginal bleeding: Secondary | ICD-10-CM

## 2018-12-02 HISTORY — DX: Gastro-esophageal reflux disease without esophagitis: K21.9

## 2018-12-02 HISTORY — PX: LAPAROSCOPIC TUBAL LIGATION: SHX1937

## 2018-12-02 HISTORY — PX: DILITATION & CURRETTAGE/HYSTROSCOPY WITH NOVASURE ABLATION: SHX5568

## 2018-12-02 HISTORY — DX: Anxiety disorder, unspecified: F41.9

## 2018-12-02 LAB — CBC
HCT: 41.2 % (ref 36.0–46.0)
Hemoglobin: 13.2 g/dL (ref 12.0–15.0)
MCH: 29.4 pg (ref 26.0–34.0)
MCHC: 32 g/dL (ref 30.0–36.0)
MCV: 91.8 fL (ref 80.0–100.0)
Platelets: 240 10*3/uL (ref 150–400)
RBC: 4.49 MIL/uL (ref 3.87–5.11)
RDW: 12.7 % (ref 11.5–15.5)
WBC: 7.8 10*3/uL (ref 4.0–10.5)
nRBC: 0 % (ref 0.0–0.2)

## 2018-12-02 LAB — ABO/RH: ABO/RH(D): O POS

## 2018-12-02 LAB — TYPE AND SCREEN
ABO/RH(D): O POS
Antibody Screen: NEGATIVE

## 2018-12-02 LAB — POCT PREGNANCY, URINE: Preg Test, Ur: NEGATIVE

## 2018-12-02 SURGERY — DILATATION & CURETTAGE/HYSTEROSCOPY WITH NOVASURE ABLATION
Anesthesia: General | Site: Uterus

## 2018-12-02 MED ORDER — ONDANSETRON HCL 4 MG/2ML IJ SOLN
INTRAMUSCULAR | Status: DC | PRN
  Administered 2018-12-02: 4 mg via INTRAVENOUS

## 2018-12-02 MED ORDER — DEXAMETHASONE SODIUM PHOSPHATE 10 MG/ML IJ SOLN
INTRAMUSCULAR | Status: AC
  Filled 2018-12-02: qty 1

## 2018-12-02 MED ORDER — MIDAZOLAM HCL 5 MG/5ML IJ SOLN
INTRAMUSCULAR | Status: DC | PRN
  Administered 2018-12-02: 2 mg via INTRAVENOUS

## 2018-12-02 MED ORDER — OXYCODONE HCL 5 MG PO TABS
5.0000 mg | ORAL_TABLET | Freq: Once | ORAL | Status: AC | PRN
  Administered 2018-12-02: 5 mg via ORAL
  Filled 2018-12-02: qty 1

## 2018-12-02 MED ORDER — LIDOCAINE 2% (20 MG/ML) 5 ML SYRINGE
INTRAMUSCULAR | Status: AC
  Filled 2018-12-02: qty 5

## 2018-12-02 MED ORDER — SUGAMMADEX SODIUM 200 MG/2ML IV SOLN
INTRAVENOUS | Status: DC | PRN
  Administered 2018-12-02: 200 mg via INTRAVENOUS

## 2018-12-02 MED ORDER — LACTATED RINGERS IV SOLN
INTRAVENOUS | Status: DC
  Filled 2018-12-02: qty 1000

## 2018-12-02 MED ORDER — FENTANYL CITRATE (PF) 100 MCG/2ML IJ SOLN
INTRAMUSCULAR | Status: DC | PRN
  Administered 2018-12-02: 25 ug via INTRAVENOUS
  Administered 2018-12-02: 75 ug via INTRAVENOUS
  Administered 2018-12-02: 100 ug via INTRAVENOUS

## 2018-12-02 MED ORDER — OXYCODONE HCL 5 MG PO TABS
ORAL_TABLET | ORAL | Status: AC
  Filled 2018-12-02: qty 1

## 2018-12-02 MED ORDER — PROPOFOL 10 MG/ML IV BOLUS
INTRAVENOUS | Status: AC
  Filled 2018-12-02: qty 40

## 2018-12-02 MED ORDER — IBUPROFEN 200 MG PO TABS: 800.0000 mg | ORAL_TABLET | Freq: Four times a day (QID) | ORAL | 0 refills | Status: DC | PRN

## 2018-12-02 MED ORDER — FENTANYL CITRATE (PF) 100 MCG/2ML IJ SOLN
INTRAMUSCULAR | Status: AC
  Filled 2018-12-02: qty 2

## 2018-12-02 MED ORDER — MIDAZOLAM HCL 2 MG/2ML IJ SOLN
INTRAMUSCULAR | Status: AC
  Filled 2018-12-02: qty 2

## 2018-12-02 MED ORDER — ONDANSETRON HCL 4 MG/2ML IJ SOLN
INTRAMUSCULAR | Status: AC
  Filled 2018-12-02: qty 2

## 2018-12-02 MED ORDER — MEPERIDINE HCL 25 MG/ML IJ SOLN
6.2500 mg | INTRAMUSCULAR | Status: DC | PRN
  Filled 2018-12-02: qty 1

## 2018-12-02 MED ORDER — OXYCODONE-ACETAMINOPHEN 5-325 MG PO TABS: 1.0000 | ORAL_TABLET | ORAL | 0 refills | Status: DC | PRN

## 2018-12-02 MED ORDER — ONDANSETRON HCL 4 MG/2ML IJ SOLN
4.0000 mg | Freq: Once | INTRAMUSCULAR | Status: DC | PRN
  Filled 2018-12-02: qty 2

## 2018-12-02 MED ORDER — ACETAMINOPHEN 325 MG PO TABS
325.0000 mg | ORAL_TABLET | ORAL | Status: DC | PRN
  Filled 2018-12-02: qty 2

## 2018-12-02 MED ORDER — GLYCOPYRROLATE PF 0.2 MG/ML IJ SOSY
PREFILLED_SYRINGE | INTRAMUSCULAR | Status: AC
  Filled 2018-12-02: qty 1

## 2018-12-02 MED ORDER — LACTATED RINGERS IV SOLN
INTRAVENOUS | Status: DC
  Administered 2018-12-02 (×2): via INTRAVENOUS
  Filled 2018-12-02: qty 1000

## 2018-12-02 MED ORDER — ACETAMINOPHEN 325 MG PO TABS
ORAL_TABLET | ORAL | Status: DC | PRN
  Administered 2018-12-02: 1000 mg via ORAL

## 2018-12-02 MED ORDER — OXYCODONE HCL 5 MG/5ML PO SOLN
5.0000 mg | Freq: Once | ORAL | Status: AC | PRN
  Filled 2018-12-02: qty 5

## 2018-12-02 MED ORDER — ACETAMINOPHEN 500 MG PO TABS
ORAL_TABLET | ORAL | Status: AC
  Filled 2018-12-02: qty 2

## 2018-12-02 MED ORDER — FENTANYL CITRATE (PF) 100 MCG/2ML IJ SOLN
25.0000 ug | INTRAMUSCULAR | Status: DC | PRN
  Administered 2018-12-02 (×3): 50 ug via INTRAVENOUS
  Filled 2018-12-02: qty 1

## 2018-12-02 MED ORDER — PROPOFOL 10 MG/ML IV BOLUS
INTRAVENOUS | Status: DC | PRN
  Administered 2018-12-02: 200 mg via INTRAVENOUS

## 2018-12-02 MED ORDER — SODIUM CHLORIDE 0.9 % IR SOLN
Status: DC | PRN
  Administered 2018-12-02: 3000 mL

## 2018-12-02 MED ORDER — KETOROLAC TROMETHAMINE 30 MG/ML IJ SOLN
INTRAMUSCULAR | Status: AC
  Filled 2018-12-02: qty 1

## 2018-12-02 MED ORDER — KETOROLAC TROMETHAMINE 30 MG/ML IJ SOLN
INTRAMUSCULAR | Status: DC | PRN
  Administered 2018-12-02: 30 mg via INTRAVENOUS

## 2018-12-02 MED ORDER — DEXAMETHASONE SODIUM PHOSPHATE 10 MG/ML IJ SOLN
INTRAMUSCULAR | Status: DC | PRN
  Administered 2018-12-02: 10 mg via INTRAVENOUS

## 2018-12-02 MED ORDER — ROCURONIUM BROMIDE 50 MG/5ML IV SOSY
PREFILLED_SYRINGE | INTRAVENOUS | Status: DC | PRN
  Administered 2018-12-02: 50 mg via INTRAVENOUS

## 2018-12-02 MED ORDER — ACETAMINOPHEN 160 MG/5ML PO SOLN
325.0000 mg | ORAL | Status: DC | PRN
  Filled 2018-12-02: qty 20.3

## 2018-12-02 MED ORDER — LIDOCAINE 2% (20 MG/ML) 5 ML SYRINGE
INTRAMUSCULAR | Status: DC | PRN
  Administered 2018-12-02: 100 mg via INTRAVENOUS

## 2018-12-02 MED ORDER — KETOROLAC TROMETHAMINE 30 MG/ML IJ SOLN
30.0000 mg | Freq: Once | INTRAMUSCULAR | Status: DC | PRN
  Filled 2018-12-02: qty 1

## 2018-12-02 MED ORDER — ROCURONIUM BROMIDE 10 MG/ML (PF) SYRINGE
PREFILLED_SYRINGE | INTRAVENOUS | Status: AC
  Filled 2018-12-02: qty 10

## 2018-12-02 MED ORDER — BUPIVACAINE HCL (PF) 0.25 % IJ SOLN
INTRAMUSCULAR | Status: DC | PRN
  Administered 2018-12-02: 4 mL

## 2018-12-02 SURGICAL SUPPLY — 55 items
ABLATOR SURESOUND NOVASURE (ABLATOR) ×3 IMPLANT
BENZOIN TINCTURE PRP APPL 2/3 (GAUZE/BANDAGES/DRESSINGS) IMPLANT
BIPOLAR CUTTING LOOP 21FR (ELECTRODE)
CANISTER SUCT 3000ML PPV (MISCELLANEOUS) IMPLANT
CATH ROBINSON RED A/P 16FR (CATHETERS) ×3 IMPLANT
CLIP FILSHIE TUBAL LIGA STRL (Clip) ×3 IMPLANT
COVER MAYO STAND STRL (DRAPES) ×3 IMPLANT
COVER SURGICAL LIGHT HANDLE (MISCELLANEOUS) ×3 IMPLANT
COVER WAND RF STERILE (DRAPES) ×3 IMPLANT
DERMABOND ADVANCED (GAUZE/BANDAGES/DRESSINGS) ×1
DERMABOND ADVANCED .7 DNX12 (GAUZE/BANDAGES/DRESSINGS) ×2 IMPLANT
DILATOR CANAL MILEX (MISCELLANEOUS) IMPLANT
DRSG COVADERM PLUS 2X2 (GAUZE/BANDAGES/DRESSINGS) IMPLANT
DRSG OPSITE POSTOP 3X4 (GAUZE/BANDAGES/DRESSINGS) IMPLANT
DRSG TEGADERM 4X4.75 (GAUZE/BANDAGES/DRESSINGS) IMPLANT
DURAPREP 26ML APPLICATOR (WOUND CARE) ×3 IMPLANT
ELECT REM PT RETURN 9FT ADLT (ELECTROSURGICAL)
ELECTRODE REM PT RTRN 9FT ADLT (ELECTROSURGICAL) IMPLANT
GAUZE 4X4 16PLY RFD (DISPOSABLE) ×3 IMPLANT
GAUZE VASELINE 1X8 (GAUZE/BANDAGES/DRESSINGS) IMPLANT
GLOVE BIO SURGEON STRL SZ 6 (GLOVE) ×9 IMPLANT
GLOVE BIOGEL PI IND STRL 6 (GLOVE) ×6 IMPLANT
GLOVE BIOGEL PI IND STRL 7.0 (GLOVE) ×2 IMPLANT
GLOVE BIOGEL PI INDICATOR 6 (GLOVE) ×3
GLOVE BIOGEL PI INDICATOR 7.0 (GLOVE) ×1
GOWN STRL REUS W/ TWL LRG LVL3 (GOWN DISPOSABLE) ×6 IMPLANT
GOWN STRL REUS W/TWL LRG LVL3 (GOWN DISPOSABLE) ×3
IV NS IRRIG 3000ML ARTHROMATIC (IV SOLUTION) ×6 IMPLANT
KIT PROCEDURE FLUENT (KITS) ×3 IMPLANT
KIT TURNOVER CYSTO (KITS) ×3 IMPLANT
LOOP CUTTING BIPOLAR 21FR (ELECTRODE) IMPLANT
NEEDLE INSUFFLATION 120MM (ENDOMECHANICALS) ×3 IMPLANT
NS IRRIG 500ML POUR BTL (IV SOLUTION) ×3 IMPLANT
PACK LAPAROSCOPY BASIN (CUSTOM PROCEDURE TRAY) ×3 IMPLANT
PACK TRENDGUARD 450 HYBRID PRO (MISCELLANEOUS) IMPLANT
PACK VAGINAL MINOR WOMEN LF (CUSTOM PROCEDURE TRAY) ×6 IMPLANT
PAD OB MATERNITY 4.3X12.25 (PERSONAL CARE ITEMS) ×3 IMPLANT
PENCIL BUTTON HOLSTER BLD 10FT (ELECTRODE) IMPLANT
SCISSORS LAP 5X35 DISP (ENDOMECHANICALS) IMPLANT
SET GENESYS HTA PROCERVA (MISCELLANEOUS) ×3 IMPLANT
SET IRRIG TUBING LAPAROSCOPIC (IRRIGATION / IRRIGATOR) IMPLANT
SPONGE GAUZE 2X2 8PLY STRL LF (GAUZE/BANDAGES/DRESSINGS) IMPLANT
STRIP CLOSURE SKIN 1/2X4 (GAUZE/BANDAGES/DRESSINGS) IMPLANT
STRIP CLOSURE SKIN 1/4X3 (GAUZE/BANDAGES/DRESSINGS) IMPLANT
STRIP CLOSURE SKIN 1/4X4 (GAUZE/BANDAGES/DRESSINGS) IMPLANT
SUT MNCRL AB 3-0 PS2 18 (SUTURE) ×3 IMPLANT
SUT VICRYL 0 UR6 27IN ABS (SUTURE) ×3 IMPLANT
TOWEL OR 17X26 10 PK STRL BLUE (TOWEL DISPOSABLE) ×3 IMPLANT
TRENDGUARD 450 HYBRID PRO PACK (MISCELLANEOUS)
TROCAR XCEL NON-BLD 11X100MML (ENDOMECHANICALS) ×3 IMPLANT
TROCAR XCEL NON-BLD 5MMX100MML (ENDOMECHANICALS) IMPLANT
TUBE CONNECTING 12X1/4 (SUCTIONS) IMPLANT
TUBING EVAC SMOKE HEATED PNEUM (TUBING) ×3 IMPLANT
WARMER LAPAROSCOPE (MISCELLANEOUS) ×3 IMPLANT
WATER STERILE IRR 500ML POUR (IV SOLUTION) IMPLANT

## 2018-12-02 NOTE — Discharge Instructions (Signed)
Call MD for T>100.4, heavy vaginal bleeding, severe abdominal pain, intractable nausea and/or vomiting, or respiratory distress.  Call office to schedule 2 week postop appointment.  Pelvic rest x 6 weeks.  No driving while taking narcotics.  DISCHARGE INSTRUCTIONS: D&C / D&E The following instructions have been prepared to help you care for yourself upon your return home.   Personal hygiene:  Use sanitary pads for vaginal drainage, not tampons.  Shower the day after your procedure.  NO tub baths, pools or Jacuzzis for 2-3 weeks.  Wipe front to back after using the bathroom.  Activity and limitations:  Do NOT drive or operate any equipment for 24 hours. The effects of anesthesia are still present and drowsiness may result.  Do NOT rest in bed all day.  Walking is encouraged.  Walk up and down stairs slowly.  You may resume your normal activity in one to two days or as indicated by your physician.  Sexual activity: NO intercourse for at least 2 weeks after the procedure, or as indicated by your physician.  Diet: Eat a light meal as desired this evening. You may resume your usual diet tomorrow.  Return to work: You may resume your work activities in one to two days or as indicated by your doctor.  What to expect after your surgery: Expect to have vaginal bleeding/discharge for 2-3 days and spotting for up to 10 days. It is not unusual to have soreness for up to 1-2 weeks. You may have a slight burning sensation when you urinate for the first day. Mild cramps may continue for a couple of days. You may have a regular period in 2-6 weeks.  Call your doctor for any of the following:  Excessive vaginal bleeding, saturating and changing one pad every hour.  Inability to urinate 6 hours after discharge from hospital.  Pain not relieved by pain medication.  Fever of 100.4 F or greater.  Unusual vaginal discharge or odor.  DISCHARGE INSTRUCTIONS: Laparoscopy  The following  instructions have been prepared to help you care for yourself upon your return home today.  Wound care:  Do not get the incision wet for the first 24 hours. The incision should be kept clean and dry.  The Band-Aids or dressings may be removed the day after surgery.  Should the incision become sore, red, and swollen after the first week, check with your doctor.  Personal hygiene:  Shower the day after your procedure.  Activity and limitations:  Do NOT drive or operate any equipment today.  Do NOT lift anything more than 15 pounds for 2-3 weeks after surgery.  Do NOT rest in bed all day.  Walking is encouraged. Walk each day, starting slowly with 5-minute walks 3 or 4 times a day. Slowly increase the length of your walks.  Walk up and down stairs slowly.  Do NOT do strenuous activities, such as golfing, playing tennis, bowling, running, biking, weight lifting, gardening, mowing, or vacuuming for 2-4 weeks. Ask your doctor when it is okay to start.  Diet: Eat a light meal as desired this evening. You may resume your usual diet tomorrow.  Return to work: This is dependent on the type of work you do. For the most part you can return to a desk job within a week of surgery. If you are more active at work, please discuss this with your doctor.  What to expect after your surgery: You may have a slight burning sensation when you urinate on the first day.  You may have a very small amount of blood in the urine. Expect to have a small amount of vaginal discharge/light bleeding for 1-2 weeks. It is not unusual to have abdominal soreness and bruising for up to 2 weeks. You may be tired and need more rest for about 1 week. You may experience shoulder pain for 24-72 hours. Lying flat in bed may relieve it.  Call your doctor for any of the following:  Develop a fever of 100.4 or greater  Inability to urinate 6 hours after discharge from hospital  Severe pain not relieved by pain medications   Persistent of heavy bleeding at incision site  Redness or swelling around incision site after a week  Increasing nausea or vomiting   Post Anesthesia Home Care Instructions  Activity: Get plenty of rest for the remainder of the day. A responsible individual must stay with you for 24 hours following the procedure.  For the next 24 hours, DO NOT: -Drive a car -Paediatric nurse -Drink alcoholic beverages -Take any medication unless instructed by your physician -Make any legal decisions or sign important papers.  Meals: Start with liquid foods such as gelatin or soup. Progress to regular foods as tolerated. Avoid greasy, spicy, heavy foods. If nausea and/or vomiting occur, drink only clear liquids until the nausea and/or vomiting subsides. Call your physician if vomiting continues.  Special Instructions/Symptoms: Your throat may feel dry or sore from the anesthesia or the breathing tube placed in your throat during surgery. If this causes discomfort, gargle with warm salt water. The discomfort should disappear within 24 hours.

## 2018-12-02 NOTE — Op Note (Signed)
Carmen Stokes DOB 1975/07/19  PREOPERATIVE DIAGNOSIS:  43 y.o. with menorrhagia, desires sterility  POSTOPERATIVE DIAGNOSIS: The same  PROCEDURE: Hysteroscopy, Dilation and Curettage, failed Novasure, HTA, laparoscopic BTL with Filshie clips  SURGEON:  Dr. [name]  INDICATIONS: 43 y.o. here for scheduled surgery for treatment of desired sterility and heavy menstrual bleeding.   Risks of surgery were discussed with the patient including but not limited to: bleeding which may require transfusion; infection which may require antibiotics; injury to uterus or surrounding organs; intrauterine scarring which may impair future fertility; need for additional procedures including laparotomy or laparoscopy; and other postoperative/anesthesia complications. Written informed consent was obtained.    FINDINGS:  A 9 week size uterus.  Atrophic appearing endometrium.  Normal ostia bilaterally.  Cavity width <2cm and therefore, unable to use Novasure.  ANESTHESIA:   General  ESTIMATED BLOOD LOSS:  Less than 20 ml  SPECIMENS: Endometrial curettings sent to pathology  COMPLICATIONS:  None immediate.  PROCEDURE DETAILS:  The patient was taken to the operating room where general anesthesia was administered and was found to be adequate.  After an adequate timeout was performed, she was placed in the dorsal lithotomy position and examined; then prepped and draped in the sterile manner.   Her bladder was catheterized for an unmeasured amount of clear, yellow urine. A speculum was then placed in the patient's vagina and a single tooth tenaculum was applied to the anterior lip of the cervix.  The uteus was sounded to 9 cm and dilated manually with metal dilators to accommodate the operative hysteroscope.  Once the cervix was dilated, the hysteroscope was inserted under direct visualization using saline as a suspension medium.  The uterine cavity was carefully examined, both ostia were recognized.   After further  careful visualization of the uterine cavity, the hysteroscope was removed under direct visualization; measuring the cervical length at 3 cm on removal.  A sharp curettage was then performed to obtain a scant amount of endometrial curettings.  The Novasure was opened and cavity length set to 6 cm.  The Novasure was seated but cavity width was <2cm.  Multiple attempts showed the same width.  The device was removed and opened and seemed to function normally.  At this point, the decision was made to abandon Novasure and attempt HTA.  While the equipment was changed out, attention was turned to the abdomen for laparoscopy.  Hulka clip was placed and the tenaculum was removed from the anterior lip of the cervix and the vaginal speculum was removed.     Attention was then turned to the patient's abdomen where a 10-mm skin incision was made on the umbilical fold.  The Veress needle was carefully introduced into the peritoneal cavity through the abdominal wall.  Intraperitoneal placement was confirmed by drop in intraabdominal pressure with insufflation of carbon dioxide gas.  Adequate pneumoperitoneum was obtained, and the 11 XL trocar was then advanced without difficulty into the abdomen where intraabdominal placement was confirmed by the operative laparoscope.  Entirely normal pelvic anatomy was noted.  The right fallopian tube was followed out the fimbriated end for confirmation.  The Filshie clip was advanced through the scope and placed approximately 3 cm from the cornual region with blanching of the tube noted.  The contralateral tube was treated in the same fashion.  Hemostasis was observed.  All instruments were removed from the abdomen.  The fascial incision was closed with 0 vicryl in a figure of 8 stitch.  The skin incision  was closed with 2-0 monocryl in a subcuticular fashion.  Dermabond was applied.    The uterine manipulator was removed from the vagina without complications. Again, single tooth tenaculum  was placed on the anterior lip of the cervix.  The HTA device was placed into the uterine cavity.  Test cycle was successful and treatment cycle was performed with no fluid deficit.  Following treatment, a blanched cavity was noted.  All instruments were removed.  Hemostasis was observed at the tenaculum site.  The patient tolerated the procedure well.  Sponge, lap, and needle counts were correct times two.  The patient was then taken to the recovery room awake, extubated and in stable  in stable condition.

## 2018-12-02 NOTE — Anesthesia Procedure Notes (Signed)
Procedure Name: Intubation Date/Time: 12/02/2018 7:38 AM Performed by: Bonney Aid, CRNA Pre-anesthesia Checklist: Patient identified, Emergency Drugs available, Suction available and Patient being monitored Patient Re-evaluated:Patient Re-evaluated prior to induction Oxygen Delivery Method: Circle system utilized Preoxygenation: Pre-oxygenation with 100% oxygen Induction Type: IV induction Ventilation: Mask ventilation without difficulty Laryngoscope Size: Mac and 3 Grade View: Grade I Tube type: Oral Tube size: 7.0 mm Number of attempts: 1 Airway Equipment and Method: Stylet Placement Confirmation: ETT inserted through vocal cords under direct vision,  positive ETCO2 and breath sounds checked- equal and bilateral Secured at: 21 cm Tube secured with: Tape Dental Injury: Teeth and Oropharynx as per pre-operative assessment

## 2018-12-02 NOTE — Transfer of Care (Signed)
Immediate Anesthesia Transfer of Care Note  Patient: Carmen Stokes  Procedure(s) Performed: DILATATION & CURETTAGE/HYSTEROSCOPY WITH NOVASURE ABLATION (N/A Uterus) LAPAROSCOPIC TUBAL LIGATION (Bilateral Abdomen)  Patient Location: PACU  Anesthesia Type:General  Level of Consciousness: drowsy  Airway & Oxygen Therapy: Patient Spontanous Breathing and Patient connected to nasal cannula oxygen  Post-op Assessment: Report given to RN  Post vital signs: Reviewed and stable  Last Vitals:  Vitals Value Taken Time  BP 128/79 12/02/18 0920  Temp    Pulse 92 12/02/18 0922  Resp 9 12/02/18 0922  SpO2 98 % 12/02/18 0922  Vitals shown include unvalidated device data.  Last Pain:  Vitals:   12/02/18 0540  TempSrc: Oral  PainSc: 0-No pain      Patients Stated Pain Goal: 4 (37/90/24 0973)  Complications: No apparent anesthesia complications

## 2018-12-02 NOTE — Anesthesia Postprocedure Evaluation (Signed)
Anesthesia Post Note  Patient: Carmen Stokes  Procedure(s) Performed: DILATATION & CURETTAGE/HYSTEROSCOPY WITH NOVASURE ABLATION (N/A Uterus) LAPAROSCOPIC TUBAL LIGATION (Bilateral Abdomen)     Patient location during evaluation: Phase II Anesthesia Type: General Level of consciousness: awake and sedated Pain management: pain level controlled Vital Signs Assessment: post-procedure vital signs reviewed and stable Respiratory status: spontaneous breathing Cardiovascular status: stable Postop Assessment: no apparent nausea or vomiting Anesthetic complications: no    Last Vitals:  Vitals:   12/02/18 0945 12/02/18 1000  BP: 136/64 138/78  Pulse: 83 85  Resp: 10 (!) 9  Temp:    SpO2: 96% 96%    Last Pain:  Vitals:   12/02/18 1015  TempSrc:   PainSc: 5    Pain Goal: Patients Stated Pain Goal: 4 (12/02/18 1015)                 Huston Foley

## 2018-12-02 NOTE — Progress Notes (Signed)
No change to H&P.  Josephine Rudnick, DO 

## 2018-12-03 ENCOUNTER — Encounter (HOSPITAL_BASED_OUTPATIENT_CLINIC_OR_DEPARTMENT_OTHER): Payer: Self-pay | Admitting: Obstetrics & Gynecology

## 2019-01-31 ENCOUNTER — Other Ambulatory Visit: Payer: Self-pay | Admitting: Physician Assistant

## 2019-02-09 ENCOUNTER — Encounter: Payer: Self-pay | Admitting: Physician Assistant

## 2019-02-09 ENCOUNTER — Ambulatory Visit (INDEPENDENT_AMBULATORY_CARE_PROVIDER_SITE_OTHER): Payer: BC Managed Care – PPO | Admitting: Physician Assistant

## 2019-02-09 DIAGNOSIS — F332 Major depressive disorder, recurrent severe without psychotic features: Secondary | ICD-10-CM

## 2019-02-09 DIAGNOSIS — Z6833 Body mass index (BMI) 33.0-33.9, adult: Secondary | ICD-10-CM

## 2019-02-09 MED ORDER — BUSPIRONE HCL 10 MG PO TABS: 10.0000 mg | ORAL_TABLET | Freq: Three times a day (TID) | ORAL | 2 refills | Status: DC

## 2019-02-09 NOTE — Progress Notes (Signed)
1008      Telephone visit  Subjective: CC: Chronic medical conditions PCP: Terald Sleeper, PA-C [id number] Carmen Stokes is a 43 y.o. female calls for telephone consult today. Patient provides verbal consent for consult held via phone.  Patient is identified with 2 separate identifiers.  At this time the entire area is on COVID-19 social distancing and stay home orders are in place.  Patient is of higher risk and therefore we are performing this by a virtual method.  Location of patient: Home Location of provider: HOME Others present for call: None    This patient reports that she has had a significant amount of stress going on over the past few months in addition to COVID-19 change in her life pattern.  She had a lot of trouble with her menstrual cycle.  And she did end up getting an ablation.  She has had her tubes tied but her cycles is getting very bad.  Gynecology is still working on that  The patient states that she has had a great increase in her depression and stress.  She had done a keto diet and lost a good amount of weight last year.  But she has gained about 30 pounds back that she had lost.  She is having a lot of anxiety her outbursts are worse, she says she is quite irritable even to everyone in her family.  Patient also has had a septoplasty performed this year.    ROS: Per HPI  No Known Allergies Past Medical History:  Diagnosis Date  . Allergy   . Anxiety   . Depression   . GERD (gastroesophageal reflux disease)     Current Outpatient Medications:  .  budesonide-formoterol (SYMBICORT) 160-4.5 MCG/ACT inhaler, Inhale 2 puffs into the lungs 2 (two) times daily., Disp: , Rfl:  .  buPROPion (WELLBUTRIN XL) 300 MG 24 hr tablet, Take 1 tablet (300 mg total) by mouth daily. (Needs to be seen before next refill), Disp: 90 tablet, Rfl: 1 .  busPIRone (BUSPAR) 10 MG tablet, Take 1 tablet (10 mg total) by mouth 3 (three) times daily., Disp: 270 tablet, Rfl: 1 .   ibuprofen (MOTRIN IB) 200 MG tablet, Take 4 tablets (800 mg total) by mouth every 6 (six) hours as needed for moderate pain., Disp: 30 tablet, Rfl: 0 .  omeprazole (PRILOSEC) 20 MG capsule, TAKE 1 CAPSULE BY MOUTH EVERY DAY, Disp: 90 capsule, Rfl: 3 .  oxyCODONE-acetaminophen (PERCOCET) 5-325 MG tablet, Take 1 tablet by mouth every 4 (four) hours as needed for severe pain., Disp: 15 tablet, Rfl: 0 .  sertraline (ZOLOFT) 50 MG tablet, TAKE 1 TABLET BY MOUTH EVERY DAY, Disp: 90 tablet, Rfl: 3  Assessment/ Plan: 43 y.o. female   1. Severe episode of recurrent major depressive disorder, without psychotic features (HCC) Wellbutrin 300 mg 1 daily BuSpar 10 mg build up to 3 times a day Zoloft 50 mg 1 daily  2. Body mass index 33.0-33.9, adult Diet and exercise  3. Morbid obesity ([place]) Diet and exercise   Return in about 4 weeks (around 03/09/2019) for recheck okay for call visit.  Continue all other maintenance medications as listed above.  Start time: 10:03 AM End time: 10:21 AM  Meds ordered this encounter  Medications  . DISCONTD: busPIRone (BUSPAR) 10 MG tablet    Sig: Take 1 tablet (10 mg total) by mouth 3 (three) times daily.    Dispense:  90 tablet    Refill:  2    Order  Specific Question:   Supervising Provider    Answer:   Janora Norlander [[id number]]    Particia Nearing PA-C [place] [phone]

## 2019-02-10 ENCOUNTER — Other Ambulatory Visit: Payer: Self-pay | Admitting: Physician Assistant

## 2019-02-10 ENCOUNTER — Telehealth: Payer: Self-pay | Admitting: Physician Assistant

## 2019-02-10 MED ORDER — BUPROPION HCL ER (XL) 300 MG PO TB24: 300.0000 mg | ORAL_TABLET | Freq: Every day | ORAL | 0 refills | Status: DC

## 2019-02-10 NOTE — Telephone Encounter (Signed)
Patient checking on RX

## 2019-02-10 NOTE — Telephone Encounter (Signed)
Sent in patient aware  

## 2019-02-11 ENCOUNTER — Encounter: Payer: Self-pay | Admitting: Physician Assistant

## 2019-02-11 ENCOUNTER — Other Ambulatory Visit: Payer: Self-pay | Admitting: *Deleted

## 2019-02-11 MED ORDER — BUPROPION HCL ER (XL) 300 MG PO TB24: 300.0000 mg | ORAL_TABLET | Freq: Every day | ORAL | 1 refills | Status: DC

## 2019-02-11 MED ORDER — BUSPIRONE HCL 10 MG PO TABS: 10.0000 mg | ORAL_TABLET | Freq: Three times a day (TID) | ORAL | 1 refills | Status: DC

## 2019-02-15 ENCOUNTER — Encounter: Payer: Self-pay | Admitting: Physician Assistant

## 2019-02-15 ENCOUNTER — Other Ambulatory Visit: Payer: Self-pay | Admitting: Physician Assistant

## 2019-02-15 MED ORDER — HYDROCODONE-HOMATROPINE 5-1.5 MG/5ML PO SYRP: 5.0000 mL | ORAL_SOLUTION | Freq: Three times a day (TID) | ORAL | 0 refills | Status: DC | PRN

## 2019-02-16 ENCOUNTER — Other Ambulatory Visit: Payer: Self-pay | Admitting: Physician Assistant

## 2019-02-16 DIAGNOSIS — N938 Other specified abnormal uterine and vaginal bleeding: Secondary | ICD-10-CM

## 2019-02-22 ENCOUNTER — Encounter: Payer: Self-pay | Admitting: Physician Assistant

## 2019-02-23 ENCOUNTER — Other Ambulatory Visit: Payer: Self-pay

## 2019-02-23 DIAGNOSIS — Z20822 Contact with and (suspected) exposure to covid-19: Secondary | ICD-10-CM

## 2019-02-25 LAB — NOVEL CORONAVIRUS, NAA: SARS-CoV-2, NAA: NOT DETECTED

## 2019-03-14 ENCOUNTER — Other Ambulatory Visit: Payer: Self-pay | Admitting: Physician Assistant

## 2019-03-14 ENCOUNTER — Encounter: Payer: Self-pay | Admitting: Physician Assistant

## 2019-03-14 MED ORDER — CYCLOBENZAPRINE HCL 10 MG PO TABS: 10.0000 mg | ORAL_TABLET | Freq: Every day | ORAL | 5 refills | Status: DC

## 2019-03-28 ENCOUNTER — Encounter: Payer: Self-pay | Admitting: Physician Assistant

## 2019-03-29 ENCOUNTER — Other Ambulatory Visit: Payer: Self-pay | Admitting: Physician Assistant

## 2019-03-29 MED ORDER — SERTRALINE HCL 100 MG PO TABS: 100.0000 mg | ORAL_TABLET | Freq: Every day | ORAL | 1 refills | Status: DC

## 2019-03-29 MED ORDER — ZOLPIDEM TARTRATE ER 6.25 MG PO TBCR: 6.2500 mg | EXTENDED_RELEASE_TABLET | Freq: Every evening | ORAL | 0 refills | Status: DC | PRN

## 2019-04-20 ENCOUNTER — Other Ambulatory Visit: Payer: Self-pay | Admitting: Physician Assistant

## 2019-04-20 DIAGNOSIS — N938 Other specified abnormal uterine and vaginal bleeding: Secondary | ICD-10-CM

## 2019-05-16 ENCOUNTER — Other Ambulatory Visit: Payer: Self-pay | Admitting: Physician Assistant

## 2019-08-09 ENCOUNTER — Other Ambulatory Visit: Payer: Self-pay | Admitting: *Deleted

## 2019-08-09 MED ORDER — BUPROPION HCL ER (XL) 300 MG PO TB24: 300.0000 mg | ORAL_TABLET | Freq: Every day | ORAL | 0 refills | Status: DC

## 2019-09-01 ENCOUNTER — Other Ambulatory Visit: Payer: Self-pay | Admitting: Family Medicine

## 2019-09-21 ENCOUNTER — Other Ambulatory Visit: Payer: Self-pay

## 2019-09-21 MED ORDER — OMEPRAZOLE 20 MG PO CPDR: DELAYED_RELEASE_CAPSULE | ORAL | 0 refills | Status: DC

## 2019-10-11 DIAGNOSIS — Z3202 Encounter for pregnancy test, result negative: Secondary | ICD-10-CM | POA: Diagnosis not present

## 2019-10-11 DIAGNOSIS — N939 Abnormal uterine and vaginal bleeding, unspecified: Secondary | ICD-10-CM | POA: Diagnosis not present

## 2019-10-18 DIAGNOSIS — N800 Endometriosis of uterus: Secondary | ICD-10-CM | POA: Diagnosis not present

## 2019-10-18 DIAGNOSIS — N946 Dysmenorrhea, unspecified: Secondary | ICD-10-CM | POA: Diagnosis not present

## 2019-10-18 DIAGNOSIS — N939 Abnormal uterine and vaginal bleeding, unspecified: Secondary | ICD-10-CM | POA: Diagnosis not present

## 2019-11-10 DIAGNOSIS — Z1159 Encounter for screening for other viral diseases: Secondary | ICD-10-CM | POA: Diagnosis not present

## 2019-11-10 DIAGNOSIS — N946 Dysmenorrhea, unspecified: Secondary | ICD-10-CM | POA: Diagnosis not present

## 2019-11-10 DIAGNOSIS — Z01419 Encounter for gynecological examination (general) (routine) without abnormal findings: Secondary | ICD-10-CM | POA: Diagnosis not present

## 2019-11-10 DIAGNOSIS — Z1231 Encounter for screening mammogram for malignant neoplasm of breast: Secondary | ICD-10-CM | POA: Diagnosis not present

## 2019-11-10 DIAGNOSIS — Z1371 Encounter for nonprocreative screening for genetic disease carrier status: Secondary | ICD-10-CM | POA: Diagnosis not present

## 2019-11-10 DIAGNOSIS — Z6833 Body mass index (BMI) 33.0-33.9, adult: Secondary | ICD-10-CM | POA: Diagnosis not present

## 2019-11-17 DIAGNOSIS — F419 Anxiety disorder, unspecified: Secondary | ICD-10-CM | POA: Diagnosis not present

## 2019-11-17 DIAGNOSIS — R102 Pelvic and perineal pain: Secondary | ICD-10-CM | POA: Diagnosis not present

## 2019-11-23 ENCOUNTER — Other Ambulatory Visit: Payer: Self-pay | Admitting: *Deleted

## 2019-11-23 NOTE — Telephone Encounter (Signed)
Former Ronnald Ramp. NTBS LOV 02/09/20 & RF 03/29/19 #30 with a refill

## 2019-11-24 NOTE — Telephone Encounter (Signed)
Left detailed message for patient to call back and schedule apt.

## 2019-12-05 ENCOUNTER — Other Ambulatory Visit: Payer: Self-pay | Admitting: *Deleted

## 2019-12-05 NOTE — Telephone Encounter (Signed)
Former Ronnald Ramp. NTBS LOV 02/09/19 30 days was given 08/09/19

## 2019-12-05 NOTE — Telephone Encounter (Signed)
Lmtcb to make an appt for med refill

## 2020-01-06 NOTE — Progress Notes (Signed)
Carmen Stokes  01/06/2020      Your procedure is scheduled on9/27/2021   Report to [facility]  at    [place].  Call this number if you have problems the morning of surgery:[phone]  OUR ADDRESS IS [address], WE ARE LOCATED [facility] WITH [facility].   Remember:  Do not eat food or drink liquids after midnight.  Take these medicines the morning of surgery with A SIP OF WATER Wellbutrin, Prilosec, Zoloft   Do not wear jewelry, make-up or nail polish.  Do not wear lotions, powders, or perfumes, or deoderant.  Do not shave 48 hours prior to surgery.  Men may shave face and neck.  Do not bring valuables to the hospital.  [facility] is not responsible for any belongings or valuables.  Contacts, dentures or bridgework may not be worn into surgery.  Leave your suitcase in the car.  After surgery it may be brought to your room.  For patients admitted to the hospital, discharge time will be determined by your treatment team.  Patients discharged the day of surgery will not be allowed to drive home.   Special instructions:    Please read over the following fact sheets that you were given:   [facility] - Preparing for Surgery Before surgery, you can play an important role.  Because skin is not sterile, your skin needs to be as free of germs as possible.  You can reduce the number of germs on your skin by washing with CHG (chlorahexidine gluconate) soap before surgery.  CHG is an antiseptic cleaner which kills germs and bonds with the skin to continue killing germs even after washing. Please DO NOT use if you have an allergy to CHG or antibacterial soaps.  If your skin becomes reddened/irritated stop using the CHG and inform your nurse when you arrive at Short Stay. Do not shave (including legs and underarms) for at least 48 hours prior to the first CHG shower.  You may shave your face/neck. Please follow these instructions carefully:  1.   Shower with CHG Soap the night before surgery and the  morning of Surgery.  2.  If you choose to wash your hair, wash your hair first as usual with your  normal  shampoo.  3.  After you shampoo, rinse your hair and body thoroughly to remove the  shampoo.                           4.  Use CHG as you would any other liquid soap.  You can apply chg directly  to the skin and wash                       Gently with a scrungie or clean washcloth.  5.  Apply the CHG Soap to your body ONLY FROM THE NECK DOWN.   Do not use on face/ open                           Wound or open sores. Avoid contact with eyes, ears mouth and genitals (private parts).                       Wash face,  Genitals (private parts) with your normal soap.  6.  Wash thoroughly, paying special attention to the area where your surgery  will be performed.  7.  Thoroughly rinse your body with warm water from the neck down.  8.  DO NOT shower/wash with your normal soap after using and rinsing off  the CHG Soap.                9.  Pat yourself dry with a clean towel.            10.  Wear clean pajamas.            11.  Place clean sheets on your bed the night of your first shower and do not  sleep with pets. Day of Surgery : Do not apply any lotions/deodorants the morning of surgery.  Please wear clean clothes to the hospital/surgery center.  FAILURE TO FOLLOW THESE INSTRUCTIONS MAY RESULT IN THE CANCELLATION OF YOUR SURGERY PATIENT SIGNATURE_________________________________  NURSE SIGNATURE__________________________________  ________________________________________________________________________

## 2020-01-09 ENCOUNTER — Encounter (HOSPITAL_COMMUNITY): Payer: Self-pay

## 2020-01-09 ENCOUNTER — Encounter (HOSPITAL_COMMUNITY)
Admission: RE | Admit: 2020-01-09 | Discharge: 2020-01-09 | Disposition: A | Discharge status: Home / Self Care | Payer: BC Managed Care – PPO | Source: Ambulatory Visit | Attending: Obstetrics & Gynecology | Admitting: Obstetrics & Gynecology

## 2020-01-09 ENCOUNTER — Other Ambulatory Visit: Payer: Self-pay

## 2020-01-09 DIAGNOSIS — Z01812 Encounter for preprocedural laboratory examination: Secondary | ICD-10-CM | POA: Insufficient documentation

## 2020-01-09 HISTORY — DX: Nausea with vomiting, unspecified: R11.2

## 2020-01-09 HISTORY — DX: Other specified postprocedural states: Z98.890

## 2020-01-09 LAB — CBC
HCT: 38.6 % (ref 36.0–46.0)
Hemoglobin: 12.5 g/dL (ref 12.0–15.0)
MCH: 29.9 pg (ref 26.0–34.0)
MCHC: 32.4 g/dL (ref 30.0–36.0)
MCV: 92.3 fL (ref 80.0–100.0)
Platelets: 234 10*3/uL (ref 150–400)
RBC: 4.18 MIL/uL (ref 3.87–5.11)
RDW: 13.3 % (ref 11.5–15.5)
WBC: 6.8 10*3/uL (ref 4.0–10.5)
nRBC: 0 % (ref 0.0–0.2)

## 2020-01-09 NOTE — Progress Notes (Signed)
Requested orders on 01/06/2020 and on 01/09/20.  [facility] for Surgery [place] for Dr [name].

## 2020-01-12 ENCOUNTER — Other Ambulatory Visit (HOSPITAL_COMMUNITY)
Admission: RE | Admit: 2020-01-12 | Discharge: 2020-01-12 | Disposition: A | Discharge status: Home / Self Care | Payer: BC Managed Care – PPO | Source: Ambulatory Visit | Attending: Obstetrics & Gynecology | Admitting: Obstetrics & Gynecology

## 2020-01-12 DIAGNOSIS — Z20822 Contact with and (suspected) exposure to covid-19: Secondary | ICD-10-CM | POA: Diagnosis not present

## 2020-01-12 DIAGNOSIS — Z01812 Encounter for preprocedural laboratory examination: Secondary | ICD-10-CM | POA: Diagnosis not present

## 2020-01-12 LAB — SARS CORONAVIRUS 2 (TAT 6-24 HRS): SARS Coronavirus 2: NEGATIVE

## 2020-01-12 NOTE — H&P (Signed)
Carmen Stokes is an 44 y.o. female G0 with pelvic pain.  Patient underwent H/S, D&C, Novasure and L/S BTL 12/02/2018 for menorrhagia, dysmenorrhea.  She did well initially but in the last few months, has developed constant pelvic pain and pressure which is worse during menses.  She also experiences dyspareunia.  Pain is not well controlled with OTC meds.  Patient declines hormonal treatment.  Last u/s 10/18/19 showed 85 cc uterine volume with 3.8 mm EMS.  Cystic areas seen near endometrial stripe and within myometrium suggestive of adenomyosis.  8 mm fibroid on posterior wall of the uterus.  Patient desires definitive management.  Pertinent Gynecological History: Menses: flow is moderate Bleeding: dysfunctional uterine bleeding Contraception: tubal ligation DES exposure: unknown Blood transfusions: none Sexually transmitted diseases: no past history Previous GYN Procedures: L/S BTL, H/S, D&C, ablation  Last mammogram: normal Date: 11/10/19 Last pap: normal Date: 06/2018 OB History: G0  Menstrual History: Menarche age: n/a Patient's last menstrual period was 12/31/2019.    Past Medical History:  Diagnosis Date  . Allergy   . Anxiety   . Depression   . GERD (gastroesophageal reflux disease)   . PONV (postoperative nausea and vomiting)     Past Surgical History:  Procedure Laterality Date  . DILITATION & CURRETTAGE/HYSTROSCOPY WITH NOVASURE ABLATION N/A 12/02/2018   Procedure: DILATATION & CURETTAGE/HYSTEROSCOPY WITH NOVASURE ABLATION;  Surgeon: [name], DO;  Location: [place];  Service: Gynecology;  Laterality: N/A;  . LAPAROSCOPIC TUBAL LIGATION Bilateral 12/02/2018   Procedure: LAPAROSCOPIC TUBAL LIGATION;  Surgeon: [name], DO;  Location: [place];  Service: Gynecology;  Laterality: Bilateral;  . SEPTOPLASTY  2020  . WISDOM TOOTH EXTRACTION      Family History  Problem Relation Age of Onset  . Arthritis Mother   . Cancer Mother    . Thyroid disease Mother   . Depression Father   . Hyperlipidemia Brother   . Hypertension Brother   . Thyroid disease Maternal Aunt     Social History:  reports that she quit smoking about 8 years ago. She has never used smokeless tobacco. She reports that she does not drink alcohol and does not use drugs.  Allergies: No Known Allergies  No medications prior to admission.    Review of Systems  Last menstrual period 12/31/2019. Physical Exam Constitutional:      Appearance: Normal appearance.  HENT:     Head: Normocephalic and atraumatic.  Cardiovascular:     Rate and Rhythm: Normal rate and regular rhythm.  Pulmonary:     Effort: Pulmonary effort is normal.     Breath sounds: Normal breath sounds.  Abdominal:     Palpations: Abdomen is soft.  Musculoskeletal:     Cervical back: Normal range of motion.  Skin:    General: Skin is warm and dry.  Neurological:     Mental Status: She is alert.  Psychiatric:        Mood and Affect: Mood normal.        Behavior: Behavior normal.     No results found for this or any previous visit (from the past 24 hour(s)).  No results found.  Assessment/Plan: 44yo G0 with pelvic pain following endometrial ablation and BTL Will proceed with LAVH, B/L salpingectomy.  Patient is counseled re: risk of bleeding, infection, scarring, and damage to surrounding structures.  She is informed of the steps of the procedure itself as well as postoperative expectations. She is counseled re: the possibility of needing  to convert to abdominal procedure if unable to safely complete laparoscopically.  All questions were answered and the patient wishes to proceed.  [name] 01/12/2020, 3:32 PM

## 2020-01-15 NOTE — Anesthesia Preprocedure Evaluation (Addendum)
Anesthesia Evaluation  Patient identified by MRN, date of birth, ID band Patient awake    Reviewed: Allergy & Precautions, NPO status , Patient's Chart, lab work & pertinent test results  History of Anesthesia Complications (+) PONV and history of anesthetic complications  Airway Mallampati: II  TM Distance: >3 FB Neck ROM: Full    Dental no notable dental hx. (+) Teeth Intact, Dental Advisory Given   Pulmonary sleep apnea , former smoker,    Pulmonary exam normal breath sounds clear to auscultation       Cardiovascular negative cardio ROS Normal cardiovascular exam Rhythm:Regular Rate:Normal     Neuro/Psych PSYCHIATRIC DISORDERS Anxiety Depression negative neurological ROS     GI/Hepatic Neg liver ROS, GERD  Medicated and Controlled,  Endo/Other  negative endocrine ROS  Renal/GU negative Renal ROS  negative genitourinary   Musculoskeletal negative musculoskeletal ROS (+)   Abdominal (+) + obese,   Peds negative pediatric ROS (+)  Hematology negative hematology ROS (+)   Anesthesia Other Findings   Reproductive/Obstetrics negative OB ROS                            Anesthesia Physical  Anesthesia Plan  ASA: II  Anesthesia Plan: General   Post-op Pain Management:    Induction: Intravenous  PONV Risk Score and Plan: 4 or greater and Ondansetron, Dexamethasone, Scopolamine patch - Pre-op and Propofol infusion  Airway Management Planned: Oral ETT and LMA  Additional Equipment: None  Intra-op Plan:   Post-operative Plan: Extubation in OR  Informed Consent: I have reviewed the patients History and Physical, chart, labs and discussed the procedure including the risks, benefits and alternatives for the proposed anesthesia with the patient or authorized representative who has indicated his/her understanding and acceptance.       Plan Discussed with: CRNA and  Anesthesiologist  Anesthesia Plan Comments:        Anesthesia Quick Evaluation

## 2020-01-16 ENCOUNTER — Encounter (HOSPITAL_BASED_OUTPATIENT_CLINIC_OR_DEPARTMENT_OTHER): Payer: Self-pay | Admitting: Obstetrics & Gynecology

## 2020-01-16 ENCOUNTER — Observation Stay (HOSPITAL_BASED_OUTPATIENT_CLINIC_OR_DEPARTMENT_OTHER): Payer: BC Managed Care – PPO | Admitting: Physician Assistant

## 2020-01-16 ENCOUNTER — Observation Stay (HOSPITAL_BASED_OUTPATIENT_CLINIC_OR_DEPARTMENT_OTHER): Payer: BC Managed Care – PPO | Admitting: Anesthesiology

## 2020-01-16 ENCOUNTER — Other Ambulatory Visit: Payer: Self-pay

## 2020-01-16 ENCOUNTER — Observation Stay (HOSPITAL_BASED_OUTPATIENT_CLINIC_OR_DEPARTMENT_OTHER)
Admission: RE | Admit: 2020-01-16 | Discharge: 2020-01-17 | Disposition: A | Discharge status: Home / Self Care | Payer: BC Managed Care – PPO | Attending: Obstetrics & Gynecology | Admitting: Obstetrics & Gynecology

## 2020-01-16 ENCOUNTER — Encounter (HOSPITAL_BASED_OUTPATIENT_CLINIC_OR_DEPARTMENT_OTHER)
Admission: RE | Disposition: A | Discharge status: Home / Self Care | Payer: Self-pay | Source: Home / Self Care | Attending: Obstetrics & Gynecology

## 2020-01-16 DIAGNOSIS — Z87891 Personal history of nicotine dependence: Secondary | ICD-10-CM | POA: Insufficient documentation

## 2020-01-16 DIAGNOSIS — K219 Gastro-esophageal reflux disease without esophagitis: Secondary | ICD-10-CM | POA: Insufficient documentation

## 2020-01-16 DIAGNOSIS — R102 Pelvic and perineal pain unspecified side: Secondary | ICD-10-CM | POA: Diagnosis present

## 2020-01-16 DIAGNOSIS — N941 Unspecified dyspareunia: Secondary | ICD-10-CM | POA: Insufficient documentation

## 2020-01-16 DIAGNOSIS — Z90710 Acquired absence of both cervix and uterus: Secondary | ICD-10-CM | POA: Diagnosis present

## 2020-01-16 HISTORY — PX: LAPAROSCOPIC VAGINAL HYSTERECTOMY WITH SALPINGECTOMY: SHX6680

## 2020-01-16 LAB — COMPREHENSIVE METABOLIC PANEL
ALT: 24 U/L (ref 0–44)
AST: 25 U/L (ref 15–41)
Albumin: 4.2 g/dL (ref 3.5–5.0)
Alkaline Phosphatase: 68 U/L (ref 38–126)
Anion gap: 13 (ref 5–15)
BUN: 15 mg/dL (ref 6–20)
CO2: 23 mmol/L (ref 22–32)
Calcium: 9.6 mg/dL (ref 8.9–10.3)
Chloride: 104 mmol/L (ref 98–111)
Creatinine, Ser: 0.84 mg/dL (ref 0.44–1.00)
GFR calc Af Amer: >60 mL/min (ref >60)
GFR calc non Af Amer: >60 mL/min (ref >60)
Glucose, Bld: 82 mg/dL (ref 70–99)
Potassium: 4 mmol/L (ref 3.5–5.1)
Sodium: 140 mmol/L (ref 135–145)
Total Bilirubin: 0.4 mg/dL (ref 0.3–1.2)
Total Protein: 7.3 g/dL (ref 6.5–8.1)

## 2020-01-16 LAB — CBC
HCT: 38.5 % (ref 36.0–46.0)
Hemoglobin: 12.6 g/dL (ref 12.0–15.0)
MCH: 29.7 pg (ref 26.0–34.0)
MCHC: 32.7 g/dL (ref 30.0–36.0)
MCV: 90.8 fL (ref 80.0–100.0)
Platelets: 230 10*3/uL (ref 150–400)
RBC: 4.24 MIL/uL (ref 3.87–5.11)
RDW: 13.5 % (ref 11.5–15.5)
WBC: 9 10*3/uL (ref 4.0–10.5)
nRBC: 0 % (ref 0.0–0.2)

## 2020-01-16 LAB — TYPE AND SCREEN
ABO/RH(D): O POS
Antibody Screen: NEGATIVE

## 2020-01-16 LAB — POCT PREGNANCY, URINE: Preg Test, Ur: NEGATIVE

## 2020-01-16 SURGERY — HYSTERECTOMY, VAGINAL, LAPAROSCOPY-ASSISTED, WITH SALPINGECTOMY
Anesthesia: General | Site: Abdomen | Laterality: Bilateral

## 2020-01-16 MED ORDER — BUPROPION HCL ER (XL) 300 MG PO TB24
300.0000 mg | ORAL_TABLET | Freq: Every day | ORAL | Status: DC
  Filled 2020-01-16: qty 1

## 2020-01-16 MED ORDER — HYDROMORPHONE HCL 1 MG/ML IJ SOLN
INTRAMUSCULAR | Status: AC
  Filled 2020-01-16: qty 1

## 2020-01-16 MED ORDER — FENTANYL CITRATE (PF) 250 MCG/5ML IJ SOLN
INTRAMUSCULAR | Status: AC
  Filled 2020-01-16: qty 5

## 2020-01-16 MED ORDER — HYDROMORPHONE HCL 1 MG/ML IJ SOLN
0.2000 mg | INTRAMUSCULAR | Status: DC | PRN
  Administered 2020-01-16 (×2): 0.5 mg via INTRAVENOUS

## 2020-01-16 MED ORDER — DEXMEDETOMIDINE (PRECEDEX) IN NS 20 MCG/5ML (4 MCG/ML) IV SYRINGE
PREFILLED_SYRINGE | INTRAVENOUS | Status: AC
  Filled 2020-01-16: qty 5

## 2020-01-16 MED ORDER — PROPOFOL 10 MG/ML IV BOLUS
INTRAVENOUS | Status: DC | PRN
  Administered 2020-01-16: 200 mg via INTRAVENOUS

## 2020-01-16 MED ORDER — ROCURONIUM BROMIDE 10 MG/ML (PF) SYRINGE
PREFILLED_SYRINGE | INTRAVENOUS | Status: AC
  Filled 2020-01-16: qty 10

## 2020-01-16 MED ORDER — ACETAMINOPHEN 500 MG PO TABS
ORAL_TABLET | ORAL | Status: AC
  Filled 2020-01-16: qty 2

## 2020-01-16 MED ORDER — ACETAMINOPHEN 500 MG PO TABS
ORAL_TABLET | ORAL | Status: AC
  Filled 2020-01-16: qty 1

## 2020-01-16 MED ORDER — PROPOFOL 10 MG/ML IV BOLUS
INTRAVENOUS | Status: AC
  Filled 2020-01-16: qty 40

## 2020-01-16 MED ORDER — POVIDONE-IODINE 10 % EX SWAB: 2.0000 "application " | Freq: Once | CUTANEOUS | Status: DC

## 2020-01-16 MED ORDER — LACTATED RINGERS IV SOLN: INTRAVENOUS | Status: DC

## 2020-01-16 MED ORDER — OXYCODONE HCL 5 MG PO TABS
ORAL_TABLET | ORAL | Status: AC
  Filled 2020-01-16: qty 1

## 2020-01-16 MED ORDER — DEXAMETHASONE SODIUM PHOSPHATE 10 MG/ML IJ SOLN
INTRAMUSCULAR | Status: DC | PRN
  Administered 2020-01-16: 10 mg via INTRAVENOUS

## 2020-01-16 MED ORDER — ACETAMINOPHEN 325 MG PO TABS
ORAL_TABLET | ORAL | Status: DC | PRN
  Administered 2020-01-16: 1000 mg via ORAL

## 2020-01-16 MED ORDER — SCOPOLAMINE 1 MG/3DAYS TD PT72
MEDICATED_PATCH | TRANSDERMAL | Status: AC
  Filled 2020-01-16: qty 1

## 2020-01-16 MED ORDER — OXYCODONE-ACETAMINOPHEN 5-325 MG PO TABS
ORAL_TABLET | ORAL | Status: AC
  Filled 2020-01-16: qty 2

## 2020-01-16 MED ORDER — KETOROLAC TROMETHAMINE 30 MG/ML IJ SOLN
INTRAMUSCULAR | Status: DC | PRN
  Administered 2020-01-16: 30 mg via INTRAVENOUS

## 2020-01-16 MED ORDER — OXYCODONE-ACETAMINOPHEN 5-325 MG PO TABS
ORAL_TABLET | ORAL | Status: AC
Start: 2020-01-16 — End: ?
  Filled 2020-01-16: qty 2

## 2020-01-16 MED ORDER — FENTANYL CITRATE (PF) 100 MCG/2ML IJ SOLN
25.0000 ug | INTRAMUSCULAR | Status: DC | PRN
  Administered 2020-01-16: 50 ug via INTRAVENOUS

## 2020-01-16 MED ORDER — DEXAMETHASONE SODIUM PHOSPHATE 10 MG/ML IJ SOLN
INTRAMUSCULAR | Status: AC
  Filled 2020-01-16: qty 1

## 2020-01-16 MED ORDER — ONDANSETRON HCL 4 MG/2ML IJ SOLN
INTRAMUSCULAR | Status: DC | PRN
  Administered 2020-01-16: 4 mg via INTRAVENOUS

## 2020-01-16 MED ORDER — ONDANSETRON HCL 4 MG/2ML IJ SOLN: 4.0000 mg | Freq: Four times a day (QID) | INTRAMUSCULAR | Status: DC | PRN

## 2020-01-16 MED ORDER — DEXMEDETOMIDINE (PRECEDEX) IN NS 20 MCG/5ML (4 MCG/ML) IV SYRINGE
PREFILLED_SYRINGE | INTRAVENOUS | Status: DC | PRN
  Administered 2020-01-16 (×2): 8 ug via INTRAVENOUS
  Administered 2020-01-16: 12 ug via INTRAVENOUS

## 2020-01-16 MED ORDER — CHLORHEXIDINE GLUCONATE 0.12 % MT SOLN: 15.0000 mL | Freq: Once | OROMUCOSAL | Status: DC

## 2020-01-16 MED ORDER — KETOROLAC TROMETHAMINE 15 MG/ML IJ SOLN
INTRAMUSCULAR | Status: AC
  Filled 2020-01-16: qty 1

## 2020-01-16 MED ORDER — SUGAMMADEX SODIUM 200 MG/2ML IV SOLN
INTRAVENOUS | Status: DC | PRN
  Administered 2020-01-16: 200 mg via INTRAVENOUS

## 2020-01-16 MED ORDER — ACETAMINOPHEN 325 MG PO TABS: 325.0000 mg | ORAL_TABLET | ORAL | Status: DC | PRN

## 2020-01-16 MED ORDER — MIDAZOLAM HCL 5 MG/5ML IJ SOLN
INTRAMUSCULAR | Status: DC | PRN
  Administered 2020-01-16: 2 mg via INTRAVENOUS

## 2020-01-16 MED ORDER — SCOPOLAMINE 1 MG/3DAYS TD PT72
1.0000 | MEDICATED_PATCH | TRANSDERMAL | Status: DC
  Administered 2020-01-16: 1.5 mg via TRANSDERMAL

## 2020-01-16 MED ORDER — OXYCODONE HCL 5 MG PO TABS
5.0000 mg | ORAL_TABLET | Freq: Once | ORAL | Status: AC | PRN
  Administered 2020-01-16: 5 mg via ORAL

## 2020-01-16 MED ORDER — FENTANYL CITRATE (PF) 100 MCG/2ML IJ SOLN
INTRAMUSCULAR | Status: DC | PRN
Start: 2020-01-16 — End: 2020-01-16
  Administered 2020-01-16: 50 ug via INTRAVENOUS
  Administered 2020-01-16: 100 ug via INTRAVENOUS
  Administered 2020-01-16: 50 ug via INTRAVENOUS

## 2020-01-16 MED ORDER — KETOROLAC TROMETHAMINE 15 MG/ML IJ SOLN
15.0000 mg | Freq: Four times a day (QID) | INTRAMUSCULAR | Status: DC
  Administered 2020-01-16 – 2020-01-17 (×3): 15 mg via INTRAVENOUS

## 2020-01-16 MED ORDER — OXYCODONE HCL 5 MG/5ML PO SOLN: 5.0000 mg | Freq: Once | ORAL | Status: AC | PRN

## 2020-01-16 MED ORDER — ROCURONIUM BROMIDE 10 MG/ML (PF) SYRINGE
PREFILLED_SYRINGE | INTRAVENOUS | Status: DC | PRN
  Administered 2020-01-16: 60 mg via INTRAVENOUS

## 2020-01-16 MED ORDER — ONDANSETRON HCL 4 MG PO TABS: 4.0000 mg | ORAL_TABLET | Freq: Four times a day (QID) | ORAL | Status: DC | PRN

## 2020-01-16 MED ORDER — ONDANSETRON HCL 4 MG/2ML IJ SOLN
INTRAMUSCULAR | Status: AC
  Filled 2020-01-16: qty 2

## 2020-01-16 MED ORDER — LIDOCAINE 2% (20 MG/ML) 5 ML SYRINGE
INTRAMUSCULAR | Status: AC
  Filled 2020-01-16: qty 5

## 2020-01-16 MED ORDER — SERTRALINE HCL 100 MG PO TABS
100.0000 mg | ORAL_TABLET | Freq: Every day | ORAL | Status: DC
  Filled 2020-01-16: qty 1

## 2020-01-16 MED ORDER — ONDANSETRON HCL 4 MG/2ML IJ SOLN: 4.0000 mg | Freq: Once | INTRAMUSCULAR | Status: DC | PRN

## 2020-01-16 MED ORDER — BUPIVACAINE HCL (PF) 0.25 % IJ SOLN
INTRAMUSCULAR | Status: DC | PRN
  Administered 2020-01-16: 4 mL

## 2020-01-16 MED ORDER — ACETAMINOPHEN 160 MG/5ML PO SOLN: 325.0000 mg | ORAL | Status: DC | PRN

## 2020-01-16 MED ORDER — LIDOCAINE 2% (20 MG/ML) 5 ML SYRINGE
INTRAMUSCULAR | Status: DC | PRN
  Administered 2020-01-16: 100 mg via INTRAVENOUS

## 2020-01-16 MED ORDER — MENTHOL 3 MG MT LOZG: 1.0000 | LOZENGE | OROMUCOSAL | Status: DC | PRN

## 2020-01-16 MED ORDER — CEFAZOLIN SODIUM-DEXTROSE 2-4 GM/100ML-% IV SOLN
2.0000 g | INTRAVENOUS | Status: AC
  Administered 2020-01-16: 2 g via INTRAVENOUS

## 2020-01-16 MED ORDER — KETOROLAC TROMETHAMINE 30 MG/ML IJ SOLN
INTRAMUSCULAR | Status: AC
  Filled 2020-01-16: qty 1

## 2020-01-16 MED ORDER — MIDAZOLAM HCL 2 MG/2ML IJ SOLN
INTRAMUSCULAR | Status: AC
  Filled 2020-01-16: qty 2

## 2020-01-16 MED ORDER — CEFAZOLIN SODIUM-DEXTROSE 2-4 GM/100ML-% IV SOLN
INTRAVENOUS | Status: AC
  Filled 2020-01-16: qty 100

## 2020-01-16 MED ORDER — FENTANYL CITRATE (PF) 100 MCG/2ML IJ SOLN
INTRAMUSCULAR | Status: AC
  Filled 2020-01-16: qty 2

## 2020-01-16 MED ORDER — OXYCODONE-ACETAMINOPHEN 5-325 MG PO TABS
1.0000 | ORAL_TABLET | ORAL | Status: DC | PRN
  Administered 2020-01-16 (×3): 2 via ORAL
  Administered 2020-01-17 (×2): 1 via ORAL

## 2020-01-16 MED ORDER — SIMETHICONE 80 MG PO CHEW: 80.0000 mg | CHEWABLE_TABLET | Freq: Four times a day (QID) | ORAL | Status: DC | PRN

## 2020-01-16 MED ORDER — MEPERIDINE HCL 25 MG/ML IJ SOLN: 6.2500 mg | INTRAMUSCULAR | Status: DC | PRN

## 2020-01-16 MED ORDER — ORAL CARE MOUTH RINSE: 15.0000 mL | Freq: Once | OROMUCOSAL | Status: DC

## 2020-01-16 SURGICAL SUPPLY — 58 items
ADH SKN CLS APL DERMABOND .7 (GAUZE/BANDAGES/DRESSINGS) ×1
BLADE CLIPPER SENSICLIP SURGIC (BLADE) IMPLANT
CANISTER SUCT 3000ML PPV (MISCELLANEOUS) ×2 IMPLANT
CATH ROBINSON RED A/P 16FR (CATHETERS) ×2 IMPLANT
COVER BACK TABLE 60X90IN (DRAPES) ×2 IMPLANT
COVER MAYO STAND STRL (DRAPES) ×4 IMPLANT
COVER WAND RF STERILE (DRAPES) ×2 IMPLANT
DECANTER SPIKE VIAL GLASS SM (MISCELLANEOUS) IMPLANT
DERMABOND ADVANCED (GAUZE/BANDAGES/DRESSINGS) ×1
DERMABOND ADVANCED .7 DNX12 (GAUZE/BANDAGES/DRESSINGS) ×1 IMPLANT
DRSG COVADERM PLUS 2X2 (GAUZE/BANDAGES/DRESSINGS) ×2 IMPLANT
DRSG OPSITE POSTOP 3X4 (GAUZE/BANDAGES/DRESSINGS) ×2 IMPLANT
DURAPREP 26ML APPLICATOR (WOUND CARE) ×2 IMPLANT
ELECT REM PT RETURN 9FT ADLT (ELECTROSURGICAL) ×2
ELECTRODE REM PT RTRN 9FT ADLT (ELECTROSURGICAL) ×1 IMPLANT
GAUZE 4X4 16PLY RFD (DISPOSABLE) ×2 IMPLANT
GLOVE BIO SURGEON STRL SZ 6 (GLOVE) ×6 IMPLANT
GLOVE BIOGEL PI IND STRL 6 (GLOVE) ×2 IMPLANT
GLOVE BIOGEL PI IND STRL 6.5 (GLOVE) ×3 IMPLANT
GLOVE BIOGEL PI IND STRL 7.5 (GLOVE) ×2 IMPLANT
GLOVE BIOGEL PI INDICATOR 6 (GLOVE) ×2
GLOVE BIOGEL PI INDICATOR 6.5 (GLOVE) ×3
GLOVE BIOGEL PI INDICATOR 7.5 (GLOVE) ×2
GLOVE ECLIPSE 6.5 STRL STRAW (GLOVE) ×10 IMPLANT
HOLDER FOLEY CATH W/STRAP (MISCELLANEOUS) ×2 IMPLANT
IV NS 1000ML (IV SOLUTION)
IV NS 1000ML BAXH (IV SOLUTION) IMPLANT
IV NS IRRIG 3000ML ARTHROMATIC (IV SOLUTION) IMPLANT
KIT TURNOVER CYSTO (KITS) ×2 IMPLANT
LIGASURE IMPACT 36 18CM CVD LR (INSTRUMENTS) ×2 IMPLANT
LIGASURE LAP L-HOOKWIRE 5 44CM (INSTRUMENTS) ×2 IMPLANT
MANIPULATOR UTERINE 4.5 ZUMI (MISCELLANEOUS) IMPLANT
NEEDLE INSUFFLATION 120MM (ENDOMECHANICALS) ×2 IMPLANT
NS IRRIG 500ML POUR BTL (IV SOLUTION) ×2 IMPLANT
PACK LAVH (CUSTOM PROCEDURE TRAY) ×2 IMPLANT
PACK ROBOTIC GOWN (GOWN DISPOSABLE) ×2 IMPLANT
PACK TRENDGUARD 450 HYBRID PRO (MISCELLANEOUS) ×1 IMPLANT
PAD OB MATERNITY 4.3X12.25 (PERSONAL CARE ITEMS) ×2 IMPLANT
PAD PREP 24X48 CUFFED NSTRL (MISCELLANEOUS) ×2 IMPLANT
SCISSORS LAP 5X35 DISP (ENDOMECHANICALS) IMPLANT
SET SUCTION IRRIG HYDROSURG (IRRIGATION / IRRIGATOR) IMPLANT
SET TUBE SMOKE EVAC HIGH FLOW (TUBING) ×2 IMPLANT
SUT MNCRL 0 MO-4 VIOLET 18 CR (SUTURE) ×2 IMPLANT
SUT MNCRL AB 3-0 PS2 18 (SUTURE) ×2 IMPLANT
SUT MONOCRYL 0 MO 4 18  CR/8 (SUTURE) ×2
SUT VIC AB 0 CT1 27 (SUTURE)
SUT VIC AB 0 CT1 27XCR 8 STRN (SUTURE) IMPLANT
SUT VIC AB 2-0 CT1 (SUTURE) ×2 IMPLANT
SUT VICRYL 0 TIES 12 18 (SUTURE) ×2 IMPLANT
SUT VICRYL 0 UR6 27IN ABS (SUTURE) ×2 IMPLANT
SYR BULB IRRIG 60ML STRL (SYRINGE) IMPLANT
TOWEL OR 17X26 10 PK STRL BLUE (TOWEL DISPOSABLE) ×2 IMPLANT
TRAY FOLEY W/BAG SLVR 14FR LF (SET/KITS/TRAYS/PACK) ×2 IMPLANT
TRENDGUARD 450 HYBRID PRO PACK (MISCELLANEOUS) ×2
TROCAR BLADELESS OPT 5 100 (ENDOMECHANICALS) ×2 IMPLANT
TROCAR XCEL NON-BLD 11X100MML (ENDOMECHANICALS) ×2 IMPLANT
WARMER LAPAROSCOPE (MISCELLANEOUS) ×2 IMPLANT
WATER STERILE IRR 500ML POUR (IV SOLUTION) IMPLANT

## 2020-01-16 NOTE — Anesthesia Procedure Notes (Signed)
Procedure Name: Intubation Date/Time: 01/16/2020 7:28 AM Performed by: Bonney Aid, CRNA Pre-anesthesia Checklist: Patient identified, Emergency Drugs available, Suction available and Patient being monitored Patient Re-evaluated:Patient Re-evaluated prior to induction Oxygen Delivery Method: Circle system utilized Preoxygenation: Pre-oxygenation with 100% oxygen Induction Type: IV induction Ventilation: Mask ventilation without difficulty Laryngoscope Size: Mac and 3 Grade View: Grade I Tube type: Oral Tube size: 7.0 mm Number of attempts: 1 Airway Equipment and Method: Stylet and Oral airway Placement Confirmation: ETT inserted through vocal cords under direct vision,  positive ETCO2 and breath sounds checked- equal and bilateral Secured at: 21 cm Tube secured with: Tape Dental Injury: Teeth and Oropharynx as per pre-operative assessment

## 2020-01-16 NOTE — Progress Notes (Signed)
NOS  No current c/o.  Pain well controlled.  No N/V.  No CP/SOB.  VSS.  AF.  Gen: A&O x 3; getting ready to ambulate Abd: soft, dressings c/d x 2 Ext: no c/c/e  POD#0 s/p LAVH, bilateral salpingectomy -Continue pain and nausea control -AM labs pending -D/C foley and IVF in AM  [name], DO

## 2020-01-16 NOTE — Progress Notes (Signed)
No change to H&P.  Channon Ambrosini, DO 

## 2020-01-16 NOTE — Op Note (Signed)
PROCEDURE DATE: 01/16/2020 PREOPERATIVE DIAGNOSIS: Dysmenorrhea, pelvic pain  POSTOPERATIVE DIAGNOSIS: The same  PROCEDURE: Laparoscopic Assisted Vaginal Hysterectomy with Bilateral Salpingectomy SURGEON: Dr. [name]  ASSISTANT: Dr. Arvella Nigh INDICATIONS: 44 y.o. G0 with dysmenorrhea and pelvic pain desiring definitive surgical management. Risks of surgery were discussed with the patient including but not limited to: bleeding which may require transfusion or reoperation; infection which may require antibiotics; injury to bowel, bladder, ureters or other surrounding organs; need for additional procedures including laparotomy; thromboembolic phenomenon, incisional problems and other postoperative/anesthesia complications. Written informed consent was obtained.  FINDINGS: Small uterus, normal adnexa bilaterally. No evidence of endometriosis. Normal upper abdomen. Adhesion of omentum to left adnexa.  ANESTHESIA: General  ESTIMATED BLOOD LOSS: 150 ml  SPECIMENS: Uterus, cervix, and bilateral fallopian tubes  COMPLICATIONS: None immediate  PROCEDURE IN DETAIL: The patient received intravenous antibiotics and had sequential compression devices applied to her lower extremities while in the preoperative area. She was then taken to the operating room where general anesthesia was administered and was found to be adequate. She was placed in the dorsal lithotomy position, and was prepped and draped in a sterile manner. An in and out catheterization was performed. A uterine manipulator was then advanced into the uterus . After an adequate timeout was performed, attention was then turned to the patient's abdomen where a 10-mm skin incision was made in the umbilical fold. The Veress needle was carefully introduced into the peritoneal cavity through the abdominal wall. Intraperitoneal placement was confirmed by drop in intraabdominal pressure with insufflation of carbon dioxide gas. Adequate pneumoperitoneum was  obtained, and the 10/11 XL trocar and sleeve were then advanced without difficulty into the abdomen where intraabdominal placement was confirmed by the laparoscope. A survey of the patient's pelvis and abdomen revealed entirely normal anatomy; bilateral Filshie clips in place.  Suprapubic 5 XL port was then placed under direct visualization. The pelvis was then carefully examined. On the right side, the fallopian tube was elevated and free from the mesosalpinx using Ligasure.  The round ligament was transected using the Ligasure. The uteroovarian ligament was also clamped, coagusated and transected with the same device. The leaves of the broad ligament were separated and serially transected.  These procedures were then repeated on the left side.  The ureters were noted to be safely away from the area of dissection.  At this point, attention was turned to the vaginal portion of the case. A weighted speculum was placed posteriorly, a Deaver anteriorly, and the cervix grasped with a thyroid tenaculum. Once the anterior and posterior reflections were identified, the cervix was circumscribed using the Bovie knife. Next, using Mayos, the posterior cul-de-sac was entered. The uterosacral ligaments were clamped, cut and suture ligated and tagged.  Next, the bladder reflection was identified. Using Metzenbaums, it was entered and palpation and direct visualization confirmed proper location. Next, using the LigaSure, the uterine arteries were coapted and cut bilaterally. The pedicles were visualized after coaptation and were hemostatic. The same was performed sequentially cephalad until the uterus, cervix, and fallopian tubes were removed (Filshie clips still in place). The pedicles were inspected and found to be hemostatic.  The posterior cuff was run in a running locked fashion using 0 vicryl stitch. Next, the uterosacrals were tied together in midline. The cuff was closed using 2-0 monocryl figure of eight stitches.  The  cuff was inspected and found to be hemostatic.  Attention was returned to the abdomen were a second laparoscopic look was taken. All  pedicles were hemostatic. Insufflation was removed after all instruments were removed.  Infraumbilical fascial incision was closed using 0 vicryl in a figure of eight stitch.  All skin incisions were closed with 3-0 monocryl subcuticular stitches and Dermabond. The patient tolerated the procedures well. All instruments, needles, and sponge counts were correct x 2. The patient was taken to the recovery room awake, extubated and in stable condition.

## 2020-01-16 NOTE — Transfer of Care (Signed)
Immediate Anesthesia Transfer of Care Note  Patient: Carmen Stokes  Procedure(s) Performed: LAPAROSCOPIC ASSISTED VAGINAL HYSTERECTOMY WITH SALPINGECTOMY (Bilateral Abdomen)  Patient Location: PACU  Anesthesia Type:General  Level of Consciousness: drowsy  Airway & Oxygen Therapy: Patient Spontanous Breathing and Patient connected to nasal cannula oxygen  Post-op Assessment: Report given to RN  Post vital signs: Reviewed and stable  Last Vitals:  Vitals Value Taken Time  BP 127/82   Temp    Pulse 107 01/16/20 0859  Resp 16 01/16/20 0859  SpO2 94 % 01/16/20 0859  Vitals shown include unvalidated device data.  Last Pain:  Vitals:   01/16/20 0646  TempSrc: Oral  PainSc: 0-No pain      Patients Stated Pain Goal: 5 (28/97/91 5041)  Complications: No complications documented.

## 2020-01-16 NOTE — Anesthesia Postprocedure Evaluation (Signed)
Anesthesia Post Note  Patient: Carmen Stokes  Procedure(s) Performed: LAPAROSCOPIC ASSISTED VAGINAL HYSTERECTOMY WITH SALPINGECTOMY (Bilateral Abdomen)     Patient location during evaluation: PACU Anesthesia Type: General Level of consciousness: awake and alert Pain management: pain level controlled Vital Signs Assessment: post-procedure vital signs reviewed and stable Respiratory status: spontaneous breathing, nonlabored ventilation, respiratory function stable and patient connected to nasal cannula oxygen Cardiovascular status: blood pressure returned to baseline and stable Postop Assessment: no apparent nausea or vomiting Anesthetic complications: no   No complications documented.  Last Vitals:  Vitals:   01/16/20 0945 01/16/20 1014  BP: 137/72 136/70  Pulse: 91 90  Resp: 15 16  Temp: (!) 36.4 C 36.5 C  SpO2: 100% 99%    Last Pain:  Vitals:   01/16/20 1014  TempSrc:   PainSc: 6                  Jinelle Butchko

## 2020-01-17 ENCOUNTER — Encounter (HOSPITAL_BASED_OUTPATIENT_CLINIC_OR_DEPARTMENT_OTHER): Payer: Self-pay | Admitting: Obstetrics & Gynecology

## 2020-01-17 DIAGNOSIS — R102 Pelvic and perineal pain: Secondary | ICD-10-CM | POA: Diagnosis not present

## 2020-01-17 LAB — COMPREHENSIVE METABOLIC PANEL
ALT: 25 U/L (ref 0–44)
AST: 28 U/L (ref 15–41)
Albumin: 3.4 g/dL — ABNORMAL LOW (ref 3.5–5.0)
Alkaline Phosphatase: 62 U/L (ref 38–126)
Anion gap: 8 (ref 5–15)
BUN: 9 mg/dL (ref 6–20)
CO2: 27 mmol/L (ref 22–32)
Calcium: 9.3 mg/dL (ref 8.9–10.3)
Chloride: 104 mmol/L (ref 98–111)
Creatinine, Ser: 0.76 mg/dL (ref 0.44–1.00)
GFR calc Af Amer: >60 mL/min (ref >60)
GFR calc non Af Amer: >60 mL/min (ref >60)
Glucose, Bld: 112 mg/dL — ABNORMAL HIGH (ref 70–99)
Potassium: 4.4 mmol/L (ref 3.5–5.1)
Sodium: 139 mmol/L (ref 135–145)
Total Bilirubin: 0.1 mg/dL — ABNORMAL LOW (ref 0.3–1.2)
Total Protein: 6.3 g/dL — ABNORMAL LOW (ref 6.5–8.1)

## 2020-01-17 LAB — CBC
HCT: 34.1 % — ABNORMAL LOW (ref 36.0–46.0)
Hemoglobin: 11.3 g/dL — ABNORMAL LOW (ref 12.0–15.0)
MCH: 30.8 pg (ref 26.0–34.0)
MCHC: 33.1 g/dL (ref 30.0–36.0)
MCV: 92.9 fL (ref 80.0–100.0)
Platelets: 224 10*3/uL (ref 150–400)
RBC: 3.67 MIL/uL — ABNORMAL LOW (ref 3.87–5.11)
RDW: 13.4 % (ref 11.5–15.5)
WBC: 15.6 10*3/uL — ABNORMAL HIGH (ref 4.0–10.5)
nRBC: 0 % (ref 0.0–0.2)

## 2020-01-17 LAB — SURGICAL PATHOLOGY

## 2020-01-17 MED ORDER — OXYCODONE-ACETAMINOPHEN 5-325 MG PO TABS
1.0000 | ORAL_TABLET | ORAL | 0 refills | Status: DC | PRN
Start: 2020-01-17 — End: 2020-12-28

## 2020-01-17 MED ORDER — OXYCODONE-ACETAMINOPHEN 5-325 MG PO TABS
ORAL_TABLET | ORAL | Status: AC
  Filled 2020-01-17: qty 1

## 2020-01-17 MED ORDER — IBUPROFEN 800 MG PO TABS: 800.0000 mg | ORAL_TABLET | Freq: Four times a day (QID) | ORAL | 1 refills | Status: DC | PRN

## 2020-01-17 MED ORDER — KETOROLAC TROMETHAMINE 15 MG/ML IJ SOLN
INTRAMUSCULAR | Status: AC
  Filled 2020-01-17: qty 1

## 2020-01-17 NOTE — Discharge Instructions (Signed)
Laparoscopically Assisted Vaginal Hysterectomy, Care After This sheet gives you information about how to care for yourself after your procedure. Your health care provider may also give you more specific instructions. If you have problems or questions, contact your health care provider. What can I expect after the procedure? After the procedure, it is common to have:  Soreness and numbness in your incision areas.  Abdominal pain. You will be given pain medicine to control it.  Vaginal bleeding and discharge. You will need to use a sanitary napkin after this procedure.  Sore throat from the breathing tube that was inserted during surgery. Follow these instructions at home: Medicines  Take over-the-counter and prescription medicines only as told by your health care provider.  Do not take aspirin or ibuprofen. These medicines can cause bleeding.  Do not drive or use heavy machinery while taking prescription pain medicine.  Do not drive for 24 hours if you were given a medicine to help you relax (sedative) during the procedure. Incision care   Follow instructions from your health care provider about how to take care of your incisions. Make sure you: ? Wash your hands with soap and water before you change your bandage (dressing). If soap and water are not available, use hand sanitizer. ? Change your dressing as told by your health care provider. ? Leave stitches (sutures), skin glue, or adhesive strips in place. These skin closures may need to stay in place for 2 weeks or longer. If adhesive strip edges start to loosen and curl up, you may trim the loose edges. Do not remove adhesive strips completely unless your health care provider tells you to do that.  Check your incision area every day for signs of infection. Check for: ? Redness, swelling, or pain. ? Fluid or blood. ? Warmth. ? Pus or a bad smell. Activity  Get regular exercise as told by your health care provider. You may be  told to take short walks every day and go farther each time.  Return to your normal activities as told by your health care provider. Ask your health care provider what activities are safe for you.  Do not douche, use tampons, or have sexual intercourse for at least 6 weeks, or until your health care provider gives you permission.  Do not lift anything that is heavier than 10 lb (4.5 kg), or the limit that your health care provider tells you, until he or she says that it is safe. General instructions  Do not take baths, swim, or use a hot tub until your health care provider approves. Take showers instead of baths.  Do not drive for 24 hours if you received a sedative.  Do not drive or operate heavy machinery while taking prescription pain medicine.  To prevent or treat constipation while you are taking prescription pain medicine, your health care provider may recommend that you: ? Drink enough fluid to keep your urine clear or pale yellow. ? Take over-the-counter or prescription medicines. ? Eat foods that are high in fiber, such as fresh fruits and vegetables, whole grains, and beans. ? Limit foods that are high in fat and processed sugars, such as fried and sweet foods.  Keep all follow-up visits as told by your health care provider. This is important. Contact a health care provider if:  You have signs of infection, such as: ? Redness, swelling, or pain around your incision sites. ? Fluid or blood coming from an incision. ? An incision that feels warm to the   touch. ? Pus or a bad smell coming from an incision.  Your incision breaks open.  Your pain medicine is not helping.  You feel dizzy or light-headed.  You have pain or bleeding when you urinate.  You have persistent nausea and vomiting.  You have blood, pus, or a bad-smelling discharge from your vagina. Get help right away if:  You have a fever.  You have severe abdominal pain.  You have chest pain.  You have  shortness of breath.  You faint.  You have pain, swelling, or redness in your leg.  You have heavy bleeding from your vagina. Summary  After the procedure, it is common to have abdominal pain and vaginal bleeding.  You should not drive or lift heavy objects until your health care provider says that it is safe.  Contact your health care provider if you have any symptoms of infection, excessive vaginal bleeding, nausea, vomiting, or shortness of breath. This information is not intended to replace advice given to you by your health care provider. Make sure you discuss any questions you have with your health care provider. Document Revised: 03/20/2017 Document Reviewed: 06/03/2016 Elsevier Patient Education  2020 [employer]  

## 2020-01-17 NOTE — Discharge Summary (Signed)
Physician Discharge Summary  Patient ID: Carmen Stokes MRN: [id number] DOB/AGE: 1975-11-24 44 y.o.  Admit date: 01/16/2020 Discharge date: 01/17/2020  Admission Diagnoses: Pelvic pain, dyspareunia  Discharge Diagnoses:  Active Problems:   Pain in female pelvis   S/P laparoscopic assisted vaginal hysterectomy (LAVH)   Discharged Condition: good  Hospital Course: Patient was admitted for anticipated LAVH, bilateral salpingectomy for treatment of pelvic pain and dyspareunia.  She underwent uncomplicated procedure; please see operative note for details.  She progressed well postoperatively and on POD#1 was meeting all goals; ambulating well, voiding without difficulty, adequate pain control on po medications and tolerating diet.  She was discharged home with anticipate follow up in the office in 2 weeks.    Consults: None  Significant Diagnostic Studies: none  Treatments: surgery: LAVH, bilateral salpingectomy  Discharge Exam: Blood pressure 134/79, pulse 72, temperature 98.5 F (36.9 C), resp. rate 14, height 5\' 7"  (1.702 m), weight 102.6 kg, last menstrual period 12/31/2019, SpO2 98 %. General appearance: alert, cooperative and appears stated age GI: soft, appropriately tender to palpation Extremities: extremities normal, atraumatic, no cyanosis or edema Incision/Wound:c/d/i x 2  Disposition: Discharge disposition: 01-Home or Self Care       Discharge Instructions    Discharge patient   Complete by: As directed    Discharge disposition: 01-Home or Self Care   Discharge patient date: 01/17/2020     Allergies as of 01/17/2020   No Known Allergies     Medication List    STOP taking these medications   acetaminophen 500 MG tablet Commonly known as: TYLENOL     TAKE these medications   azelastine 0.1 % nasal spray Commonly known as: ASTELIN Place 1 spray into both nostrils at bedtime. Use in each nostril as directed   buPROPion 300 MG 24 hr tablet Commonly known  as: WELLBUTRIN XL Take 1 tablet (300 mg total) by mouth daily. (Needs to be seen before next refill) What changed: additional instructions   ibuprofen 800 MG tablet Commonly known as: ADVIL Take 1 tablet (800 mg total) by mouth every 6 (six) hours as needed. What changed:   medication strength  reasons to take this   omeprazole 20 MG capsule Commonly known as: PRILOSEC TAKE 1 CAPSULE BY MOUTH EVERY DAY.  Needs to be seen for further refills. What changed:   how much to take  how to take this  when to take this  additional instructions   oxyCODONE-acetaminophen 5-325 MG tablet Commonly known as: PERCOCET/ROXICET Take 1 tablet by mouth every 4 (four) hours as needed for moderate pain.   sertraline 100 MG tablet Commonly known as: ZOLOFT Take 1 tablet (100 mg total) by mouth daily. TAKE 1 TABLET BY MOUTH EVERY DAY What changed:   when to take this  additional instructions        Signed: [name] 01/17/2020, 8:37 AM

## 2020-05-23 ENCOUNTER — Encounter: Payer: Self-pay | Admitting: Allergy & Immunology

## 2020-05-23 ENCOUNTER — Other Ambulatory Visit: Payer: Self-pay

## 2020-05-23 ENCOUNTER — Ambulatory Visit (INDEPENDENT_AMBULATORY_CARE_PROVIDER_SITE_OTHER): Payer: BC Managed Care – PPO | Admitting: Allergy & Immunology

## 2020-05-23 VITALS — BP 100/80 | HR 95 | Temp 98.3°F | Resp 18 | Ht 68.5 in | Wt 228.2 lb

## 2020-05-23 DIAGNOSIS — R0602 Shortness of breath: Secondary | ICD-10-CM | POA: Diagnosis not present

## 2020-05-23 DIAGNOSIS — J310 Chronic rhinitis: Secondary | ICD-10-CM | POA: Diagnosis not present

## 2020-05-23 NOTE — Progress Notes (Signed)
NEW PATIENT  Date of Service/Encounter:  05/23/20  Referring provider: Pcp, No   Assessment:   Chronic rhinitis   SOB (shortness of breath)   GERD - on omeprazole  COVID19 infection in September 2020   Carmen Stokes presents for evaluation of shortness of breath.  This has been going on for a couple years and actually preceded her COVID-19 diagnosis.  It seems that Advair did help her, although the prescribing physician retired and she never got refills for this.  She has not received albuterol with has a questionable efficacy according to our discussion today.  Her spirometry today looks normal and she had minimal improvement with the albuterol treatment, so still entirely convinced that this is asthma.  He would be worth it with her on inhaled steroid for 2 to 4 weeks to see how she does with this.  We provided her with samples we have in the office today.  Her environmental allergy panel was negative his entire panel.  She was not having any life chronic rhinitis symptoms, therefore we did not proceed to intradermal testing.  Plan/Recommendations:   1. Chronic rhinitis - Testing was negative to the entire panel.  - I do not think that we need to do more testing at this time. =  2. SOB (shortness of breath) - Lung testing looks normal today, but it did improve slightly with the albuterol treatment. - We are going to go ahead and start a controller medication to see if this helps at all. - Sometimes the diagnosis of asthma is difficult to make, but we will work together to try to figure this out.  - The definitive diagnosis of asthma is made with a methacholine challenge, which is a breathing test done in the hospital where they literally give you a medication to cause you to have an asthma attack (this can be terrifying).  - Spacer sample and demonstration provided. - Daily controller medication(s): Alvesco 160cg two puffs twice daily with a spacer. - Prior to physical activity:  albuterol 2 puffs 10-15 minutes before physical activity. - Rescue medications: albuterol 4 puffs every 4-6 hours as needed - Asthma control goals:  * Full participation in all desired activities (may need albuterol before activity) * Albuterol use two time or less a week on average (not counting use with activity) * Cough interfering with sleep two time or less a month * Oral steroids no more than once a year * No hospitalizations  3. Return in about 4 weeks (around 06/20/2020).    Subjective:   Carmen Stokes is a 45 y.o. female presenting today for evaluation of  Chief Complaint  Patient presents with  . Breathing Problem    Possible asthma    Carmen Stokes has a history of the following: Patient Active Problem List   Diagnosis Date Noted  . Pain in female pelvis 01/16/2020  . S/P laparoscopic assisted vaginal hysterectomy (LAVH) 01/16/2020  . Body mass index 33.0-33.9, adult 12/29/2017  . Pelvic pain 11/04/2017  . Dysfunctional uterine bleeding 11/04/2017  . Sleep apnea 08/19/2016  . IBS (irritable bowel syndrome) 03/05/2015  . Routine general medical examination at a health care facility 12/19/2013  . Hearing loss in right ear 12/19/2013  . MDD (major depressive disorder) 10/11/2013  . Morbid obesity ([place]) 10/11/2013  . Seasonal allergies 10/11/2013  . Heartburn 10/11/2013    History obtained from: chart review and patient.  Carmen Stokes was referred by Pcp, No.     Carmen Stokes  is a 45 y.o. female presenting for an evaluation of shortness of breath.  She started having breathing issues in 2020. She went to see ENT and had a septal deviation repaired. This did help some congestion and she is doing well there, ut she continued to have some chest congestion. She did get albuterol inhaler. She is not really using the albuterol but only using it when she has "bronchitis". She does not notice any different. She did not have asthma as a child.   She did smoke for  around 20 years. She stopped several years ago.    Asthma/Respiratory Symptom History: She has never gotten a formal diagnosis of asthma. She did have COVID19 in October 2020. The breathing issues actually started before the COVID19 diagnosis. She did go to Urgent Care last night and she was noted to have wheezing. She has never had nebulizer treatments. She does not cough at night. The mornings are worse with the amount of congestion. She does have reflux and is on Prilosec. She has been on this for years.  We did look at some pictures and she points to Advair. She apparently got this from the ENT. She reports that this worked well and she felt completely normal. She thinks that she felt normal on it.   Allergic Rhinitis Symptom History: She did have alleryg testing done when she was a child. She qas not allergic to anything but she would have "sinus infections" all of the time. She said that the last time that she had a sinus infection was May 2020 prior to the surgery.  She never had a CXR done during this time. She denies fevers. There is no family history of asthma. She has no history of pulmonary embolisms. \  Otherwise, there is no history of other atopic diseases, including food allergies, drug allergies, stinging insect allergies, eczema, urticaria or contact dermatitis. There is no significant infectious history. Vaccinations are up to date.    Past Medical History: Patient Active Problem List   Diagnosis Date Noted  . Pain in female pelvis 01/16/2020  . S/P laparoscopic assisted vaginal hysterectomy (LAVH) 01/16/2020  . Body mass index 33.0-33.9, adult 12/29/2017  . Pelvic pain 11/04/2017  . Dysfunctional uterine bleeding 11/04/2017  . Sleep apnea 08/19/2016  . IBS (irritable bowel syndrome) 03/05/2015  . Routine general medical examination at a health care facility 12/19/2013  . Hearing loss in right ear 12/19/2013  . MDD (major depressive disorder) 10/11/2013  . Morbid obesity  ([place]) 10/11/2013  . Seasonal allergies 10/11/2013  . Heartburn 10/11/2013    Medication List:  Allergies as of 05/23/2020   No Known Allergies     Medication List       Accurate as of May 23, 2020 12:32 PM. If you have any questions, ask your nurse or doctor.        albuterol 108 (90 Base) MCG/ACT inhaler Commonly known as: VENTOLIN HFA SMARTSIG:1-2 Puff(s) Via Inhaler Every 4-6 Hours PRN   albuterol 108 (90 Base) MCG/ACT inhaler Commonly known as: VENTOLIN HFA 1-2 inhalations every 4-6 hours as needed for wheezing. Dispense spacer as needed.   azelastine 0.1 % nasal spray Commonly known as: ASTELIN Place 1 spray into both nostrils at bedtime. Use in each nostril as directed   buPROPion 300 MG 24 hr tablet Commonly known as: WELLBUTRIN XL Take 1 tablet (300 mg total) by mouth daily. (Needs to be seen before next refill) What changed: additional instructions   ibuprofen 800 MG tablet  Commonly known as: ADVIL Take 1 tablet (800 mg total) by mouth every 6 (six) hours as needed.   omeprazole 20 MG capsule Commonly known as: PRILOSEC TAKE 1 CAPSULE BY MOUTH EVERY DAY.  Needs to be seen for further refills. What changed:   how much to take  how to take this  when to take this  additional instructions   oxyCODONE-acetaminophen 5-325 MG tablet Commonly known as: PERCOCET/ROXICET Take 1 tablet by mouth every 4 (four) hours as needed for moderate pain.   sertraline 100 MG tablet Commonly known as: ZOLOFT Take 1 tablet (100 mg total) by mouth daily. TAKE 1 TABLET BY MOUTH EVERY DAY What changed:   when to take this  additional instructions       Birth History: non-contributory  Developmental History: non-contributory  Past Surgical History: Past Surgical History:  Procedure Laterality Date  . DILITATION & CURRETTAGE/HYSTROSCOPY WITH NOVASURE ABLATION N/A 12/02/2018   Procedure: DILATATION & CURETTAGE/HYSTEROSCOPY WITH NOVASURE ABLATION;  Surgeon:  [name], DO;  Location: [facility];  Service: Gynecology;  Laterality: N/A;  . LAPAROSCOPIC TUBAL LIGATION Bilateral 12/02/2018   Procedure: LAPAROSCOPIC TUBAL LIGATION;  Surgeon: [name], DO;  Location: [facility];  Service: Gynecology;  Laterality: Bilateral;  . LAPAROSCOPIC VAGINAL HYSTERECTOMY WITH SALPINGECTOMY Bilateral 01/16/2020   Procedure: LAPAROSCOPIC ASSISTED VAGINAL HYSTERECTOMY WITH SALPINGECTOMY;  Surgeon: [name], DO;  Location: [facility];  Service: Gynecology;  Laterality: Bilateral;  . SEPTOPLASTY  2020  . SINOSCOPY    . WISDOM TOOTH EXTRACTION       Family History: Family History  Problem Relation Age of Onset  . Arthritis Mother   . Cancer Mother   . Thyroid disease Mother   . Depression Father   . Hyperlipidemia Brother   . Hypertension Brother   . Thyroid disease Maternal Aunt      Social History: Carmen Stokes lives at home with her husband.  They live in a house that is 43 years old.  There is carpeting throughout the home.  She has electric heating and central cooling.  There is a cat inside of the home.  He has had past for 10+ years.  There are no dust mite covers on the bedding.  There is no tobacco exposure.  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  She did smoke from 85 is 2012, around 1/2 pack/day.  She stopped when she got married.   Review of Systems  Constitutional: Negative.  Negative for fever, malaise/fatigue and weight loss.  HENT: Positive for congestion. Negative for ear discharge and ear pain.        Positive for postnasal drip.  Eyes: Negative for pain, discharge and redness.  Respiratory: Positive for cough and shortness of breath. Negative for sputum production and wheezing.   Cardiovascular: Negative.  Negative for chest pain and palpitations.  Gastrointestinal: Negative for abdominal pain, constipation, diarrhea, heartburn, nausea and vomiting.  Skin:  Negative.  Negative for itching and rash.  Neurological: Negative for dizziness and headaches.  Endo/Heme/Allergies: Negative for environmental allergies. Does not bruise/bleed easily.       Objective:   Blood pressure 100/80, pulse 95, temperature 98.3 F (36.8 C), temperature source Temporal, resp. rate 18, height 5' 8.5" (1.74 m), weight 228 lb 3.2 oz (103.5 kg), SpO2 98 %. Body mass index is 34.19 kg/m.   Physical Exam:   Physical Exam Constitutional:      Appearance: She is well-developed.  Comments: Pleasant female. Talkative.   HENT:     Head: Normocephalic and atraumatic.     Right Ear: Tympanic membrane, ear canal and external ear normal. No drainage, swelling or tenderness. Tympanic membrane is not injected, scarred, erythematous, retracted or bulging.     Left Ear: Tympanic membrane, ear canal and external ear normal. No drainage, swelling or tenderness. Tympanic membrane is not injected, scarred, erythematous, retracted or bulging.     Nose: No nasal deformity, septal deviation, mucosal edema, rhinorrhea or epistaxis.     Right Sinus: No maxillary sinus tenderness or frontal sinus tenderness.     Left Sinus: No maxillary sinus tenderness or frontal sinus tenderness.     Mouth/Throat:     Mouth: Oropharynx is clear and moist. Mucous membranes are not pale and not dry.     Pharynx: Uvula midline.  Eyes:     General:        Right eye: No discharge.        Left eye: No discharge.     Extraocular Movements: EOM normal.     Conjunctiva/sclera: Conjunctivae normal.     Right eye: Right conjunctiva is not injected. No chemosis.    Left eye: Left conjunctiva is not injected. No chemosis.    Pupils: Pupils are equal, round, and reactive to light.  Cardiovascular:     Rate and Rhythm: Normal rate and regular rhythm.     Heart sounds: Normal heart sounds.  Pulmonary:     Effort: Pulmonary effort is normal. No tachypnea, accessory muscle usage or respiratory distress.      Breath sounds: Normal breath sounds. No wheezing, rhonchi or rales.  Chest:     Chest wall: No tenderness.  Abdominal:     Tenderness: There is no abdominal tenderness. There is no guarding or rebound.  Lymphadenopathy:     Head:     Right side of head: No submandibular, tonsillar or occipital adenopathy.     Left side of head: No submandibular, tonsillar or occipital adenopathy.     Cervical: No cervical adenopathy.  Skin:    Coloration: Skin is not pale.     Findings: No abrasion, erythema, petechiae or rash. Rash is not papular, urticarial or vesicular.  Neurological:     Mental Status: She is alert.  Psychiatric:        Mood and Affect: Mood and affect normal.      Diagnostic studies:    Spirometry: results normal (FEV1: 2.94/89%, FVC: 3.78/92%, FEV1/FVC: 78%).    Spirometry consistent with normal pattern. Xopenex four puffs via MDI treatment given in clinic with no improvement.  Allergy Studies:     Airborne Adult Perc - 05/23/20 0930    Time Antigen Placed 0930    Location Back    Number of Test 59    1. Control-Buffer 50% Glycerol Negative    2. Control-Histamine 1 mg/ml 2+    3. Albumin saline Negative    4. [place] Negative    5. [place] Negative    6. [place] Negative    7. [place] Blue Negative    8. Meadow Fescue Negative    9. Perennial Rye Negative    10. Sweet Vernal Negative    11. Timothy Negative    12. Cocklebur Negative    13. Burweed Marshelder Negative    14. Ragweed, short Negative    15. Ragweed, Giant Negative    16. Plantain,  English Negative    17. Lamb's Quarters Negative  18. Sheep Sorrell Negative    19. Rough Pigweed Negative    20. Marsh Elder, Rough Negative    21. Mugwort, Common Negative    22. Ash mix Negative    23. Birch mix Negative    24. Beech American Negative    25. Box, Elder Negative    26. Cedar, red Negative    27. Cottonwood, [place] Negative    28. Elm mix Negative    29. Hickory Negative    30. Maple  mix Negative    31. Oak, [place] mix Negative    32. Pecan Pollen Negative    33. Pine mix Negative    34. Sycamore Eastern Negative    35. [place], Black Pollen Negative    36. Alternaria alternata Negative    37. Cladosporium Herbarum Negative    38. Aspergillus mix Negative    39. Penicillium mix Negative    40. Bipolaris sorokiniana (Helminthosporium) Negative    41. Drechslera spicifera (Curvularia) Negative    42. Mucor plumbeus Negative    43. Fusarium moniliforme Negative    44. Aureobasidium pullulans (pullulara) Negative    45. Rhizopus oryzae Negative    46. Botrytis cinera Negative    47. Epicoccum nigrum Negative    48. Phoma betae Negative    49. Candida Albicans Negative    50. Trichophyton mentagrophytes Negative    51. Mite, D Farinae  5,000 AU/ml Negative    52. Mite, D Pteronyssinus  5,000 AU/ml Negative    53. Cat Hair 10,000 BAU/ml Negative    54.  Dog Epithelia Negative    55. Mixed Feathers Negative    56. Horse Epithelia Negative    57. Cockroach, German Negative    58. Mouse Negative    59. Tobacco Leaf Negative           Allergy testing results were read and interpreted by myself, documented by clinical staff.         Salvatore Marvel, MD [facility] [place]

## 2020-05-23 NOTE — Patient Instructions (Addendum)
1. Chronic rhinitis - Testing was negative to the entire panel.  - I do not think that we need to do more testing at this time. =  2. SOB (shortness of breath) - Lung testing looks normal today, but it did improve slightly with the albuterol treatment. - We are going to go ahead and start a controller medication to see if this helps at all. - Sometimes the diagnosis of asthma is difficult to make, but we will work together to try to figure this out.  - The definitive diagnosis of asthma is made with a methacholine challenge, which is a breathing test done in the hospital where they literally give you a medication to cause you to have an asthma attack (this can be terrifying).  - Spacer sample and demonstration provided. - Daily controller medication(s): Alvesco 160cg two puffs twice daily with a spacer. - Prior to physical activity: albuterol 2 puffs 10-15 minutes before physical activity. - Rescue medications: albuterol 4 puffs every 4-6 hours as needed - Asthma control goals:  * Full participation in all desired activities (may need albuterol before activity) * Albuterol use two time or less a week on average (not counting use with activity) * Cough interfering with sleep two time or less a month * Oral steroids no more than once a year * No hospitalizations  3. Return in about 4 weeks (around 06/20/2020).    Please inform us of any Emergency Department visits, hospitalizations, or changes in symptoms. Call us before going to the ED for breathing or allergy symptoms since we might be able to fit you in for a sick visit. Feel free to contact us anytime with any questions, problems, or concerns.  It was a pleasure to meet you today!  Websites that have reliable patient information: 1. American Academy of Asthma, Allergy, and Immunology: www.aaaai.org 2. Food Allergy Research and Education (FARE): foodallergy.org 3. Mothers of Asthmatics: http://www.asthmacommunitynetwork.org 4. American  College of Allergy, Asthma, and Immunology: www.acaai.org   COVID-19 Vaccine Information can be found at: ShippingScam.co.uk For questions related to vaccine distribution or appointments, please email vaccine@[email or web address] .com or call [phone].     "Like" [place] on Facebook and Instagram for our latest updates!       Make sure you are registered to vote! If you have moved or changed any of your contact information, you will need to get this updated before voting!  In some cases, you MAY be able to register to vote online: [email or web address]

## 2020-05-30 ENCOUNTER — Encounter: Payer: Self-pay | Admitting: Allergy & Immunology

## 2020-05-30 MED ORDER — ADVAIR HFA 115-21 MCG/ACT IN AERO: 2.0000 | INHALATION_SPRAY | Freq: Two times a day (BID) | RESPIRATORY_TRACT | 5 refills | Status: DC

## 2020-05-30 NOTE — Addendum Note (Signed)
Addended by: Valentina Shaggy on: 05/30/2020 11:02 PM   Modules accepted: Orders

## 2020-06-07 ENCOUNTER — Telehealth: Payer: Self-pay

## 2020-06-07 NOTE — Telephone Encounter (Signed)
Patient called requesting a Hotel manager Medication. Patient was going to order a nebulizer off of amaz[employer] but I told her I think it would be cheaper if she gets one from our practice.  Please advise.    Pharmacy: [facility]

## 2020-06-07 NOTE — Telephone Encounter (Signed)
Left detail message letting patient know that they can come to our office to sign for a nebulizer machine and take one home as long as she hasn't had one in 3 years then insurance could probably cover the cost.

## 2020-06-07 NOTE — Telephone Encounter (Signed)
She can pick one up at our [place] office.   Salvatore Marvel, MD Allergy and [facility] [place]

## 2020-06-08 MED ORDER — ALBUTEROL SULFATE (2.5 MG/3ML) 0.083% IN NEBU: 2.5000 mg | INHALATION_SOLUTION | Freq: Four times a day (QID) | RESPIRATORY_TRACT | 1 refills | Status: DC | PRN

## 2020-06-08 NOTE — Telephone Encounter (Signed)
Patient came to pick up nebulizer. Sent in albuterol nebulizer solution.  Salvatore Marvel, MD [facility] and [facility] [place]

## 2020-06-20 ENCOUNTER — Ambulatory Visit: Payer: BC Managed Care – PPO | Admitting: Family

## 2020-07-03 NOTE — Patient Instructions (Addendum)
Asthma Start Breztri 2 puffs twice a day with spacer to help prevent cough and wheeze. Samples given Stop Advair 115/21 May use albuterol 2 puffs every 4 hours as needed for cough, wheeze, tightness in chest, or shortness of breath OR may use albuterol 0.083% via nebulizer 1 unit dose every 4 hours as needed PA/lateral chest x-ray due to shortness of breath  Chronic rhinitis Skin testing was negative on 06/12/2020  Hoarseness We will refer you to ENT to further follow up on this  Please let us know if this treatment plan is not working well for you Schedule a follow up appointment in 2 months

## 2020-07-04 ENCOUNTER — Other Ambulatory Visit: Payer: Self-pay

## 2020-07-04 ENCOUNTER — Ambulatory Visit (HOSPITAL_COMMUNITY)
Admission: RE | Admit: 2020-07-04 | Discharge: 2020-07-04 | Disposition: A | Discharge status: Home / Self Care | Payer: BC Managed Care – PPO | Source: Ambulatory Visit | Attending: Family | Admitting: Family

## 2020-07-04 ENCOUNTER — Ambulatory Visit: Payer: BC Managed Care – PPO | Admitting: Family

## 2020-07-04 VITALS — BP 132/80 | HR 79 | Temp 98.3°F | Resp 18

## 2020-07-04 DIAGNOSIS — J4540 Moderate persistent asthma, uncomplicated: Secondary | ICD-10-CM | POA: Diagnosis not present

## 2020-07-04 DIAGNOSIS — R0602 Shortness of breath: Secondary | ICD-10-CM | POA: Insufficient documentation

## 2020-07-04 DIAGNOSIS — R490 Dysphonia: Secondary | ICD-10-CM | POA: Diagnosis not present

## 2020-07-04 DIAGNOSIS — J310 Chronic rhinitis: Secondary | ICD-10-CM | POA: Diagnosis not present

## 2020-07-04 MED ORDER — BREZTRI AEROSPHERE 160-9-4.8 MCG/ACT IN AERO: 2.0000 | INHALATION_SPRAY | Freq: Two times a day (BID) | RESPIRATORY_TRACT | 3 refills | Status: DC

## 2020-07-04 NOTE — Progress Notes (Signed)
Please let [name] know that her chest x-ray is normal. Thank you

## 2020-07-04 NOTE — Progress Notes (Signed)
[place], [address] [name] [address] Dept: [phone]  FOLLOW UP NOTE  Patient ID: Carmen Stokes, female    DOB: 11/20/1975  Age: 45 y.o. MRN: [id number] Date of Office Visit: 07/04/2020  Assessment  Chief Complaint: No chief complaint on file.  HPI Carmen Stokes 45 year old female who presents today for follow-up on chronic rhinitis, and shortness of breath.  She was last seen on May 23, 2020 by Dr. Ernst Bowler.  Chronic rhinitis is reported as controlled.  She reports that she had septoplasty in 2020 and since then has had clear rhinorrhea and occasional postnasal drip.  She denies any nasal congestion.  Since her last office visit she has not had any sinus infections.  She does mention that she does have hoarseness and has not seen ENT since she had her surgery.  Shortness of breath is reported as not well controlled with Advair 115/21 2 puffs twice a day and albuterol as needed.  She does not feel that Advair has been helping her symptoms.  She reports that in the past that a physician gave her Symbicort and that helped at the time.  She reports wheezing at times, tightness in her chest at times, and the sensation of her breathing feeling heavy at times.  She denies any coughing.  She is interested in getting an x-ray of her lungs today.  She reports that something does not feel right and has not since the end of 2020 and having COVID-19.  She is currently using her albuterol nebulizer 2-3 times a week and albuterol HFA approximately 4 times a week.  She does feel that the albuterol only helps a little.  She quit smoking in 2012 and smoked half a pack to pack a day for approximately 20 years.  Since her last office visit she has had 1 round of steroids for bronchitis which she had in December.    Drug Allergies:  No Known Allergies  Review of Systems: Review of Systems  Constitutional: Negative for chills and fever.  HENT:       Reports clear rhinorrhea and  occasional postnasal drip.  Denies nasal congestion.  She has not had any sinus infections since she was last seen.  Eyes:       Denies itchy watery eyes  Respiratory:       Reports tightness in her chest at times, wheezing at times, and the sensation of her breathing feeling heavy at times.  She denies any coughing  Cardiovascular: Negative for chest pain and palpitations.  Gastrointestinal: Positive for heartburn.       Ports heartburn at times.  Taking Prilosec.  Genitourinary: Positive for dysuria.       She reports that her gynecologist recently sent her a prescription for Augmentin for" bladder issues".  She is picking this up today.  Skin: Negative for itching and rash.  Neurological: Negative for headaches.  Endo/Heme/Allergies: Negative for environmental allergies.     Physical Exam: BP 132/80 (BP Location: Right Arm, Patient Position: Sitting, Cuff Size: Large)    Pulse 79    Temp 98.3 F (36.8 C) (Temporal)    Resp 18    LMP 12/31/2019    SpO2 97%    Physical Exam Constitutional:      Appearance: Normal appearance.  HENT:     Head: Normocephalic and atraumatic.     Comments: Pharynx normal, eyes normal, ears normal, nose: Slightly erythematous with no drainage noted    Right Ear: Tympanic membrane,  ear canal and external ear normal.     Left Ear: Tympanic membrane, ear canal and external ear normal.     Mouth/Throat:     Mouth: Mucous membranes are moist.     Pharynx: Oropharynx is clear.  Eyes:     Conjunctiva/sclera: Conjunctivae normal.  Cardiovascular:     Rate and Rhythm: Regular rhythm.     Heart sounds: Normal heart sounds.  Pulmonary:     Effort: Pulmonary effort is normal.     Breath sounds: Normal breath sounds.     Comments: Lungs clear to auscultation Musculoskeletal:     Cervical back: Neck supple.  Skin:    General: Skin is warm.  Neurological:     Mental Status: She is alert and oriented to person, place, and time.  Psychiatric:        Mood and  Affect: Mood normal.        Behavior: Behavior normal.        Thought Content: Thought content normal.        Judgment: Judgment normal.     Diagnostics: FVC 3.19 L, FEV1 2.59 L..  Predicted FVC 4.13 L, FEV1 3.30 L.  Spirometry indicates mild restriction.  Status post bronchodilator response shows FVC 3.42 L, FEV1 2.93 L.  Spirometry indicates normal ventilatory function with a 13% change in FEV1  Assessment and Plan: 1. Not well controlled moderate persistent asthma   2. SOB (shortness of breath)   3. Chronic rhinitis   4. Hoarseness     Meds ordered this encounter  Medications   Budeson-Glycopyrrol-Formoterol (BREZTRI AEROSPHERE) 160-9-4.8 MCG/ACT AERO    Sig: Inhale 2 puffs into the lungs in the morning and at bedtime.    Dispense:  10.9 g    Refill:  3    Patient Instructions  Asthma Start Breztri 2 puffs twice a day with spacer to help prevent cough and wheeze. Samples given Stop Advair 115/21 May use albuterol 2 puffs every 4 hours as needed for cough, wheeze, tightness in chest, or shortness of breath OR may use albuterol 0.083% via nebulizer 1 unit dose every 4 hours as needed PA/lateral chest x-ray due to shortness of breath  Chronic rhinitis Skin testing was negative on 06/12/2020  Hoarseness We will refer you to ENT to further follow up on this  Please let us know if this treatment plan is not working well for you Schedule a follow up appointment in 2 months   Return in about 2 months (around 09/03/2020), or if symptoms worsen or fail to improve.    Thank you for the opportunity to care for this patient.  Please do not hesitate to contact me with questions.  Althea Charon, FNP [facility]

## 2020-07-05 ENCOUNTER — Telehealth: Payer: Self-pay | Admitting: Family

## 2020-07-05 NOTE — Telephone Encounter (Signed)
Referral faxed to Dr. Benjamine Mola, [phone] along with notes and demographics. Left detailed message on patient's phone.   Dr. Benjamine Mola  [address]  [address] [place] [address] [phone]

## 2020-07-05 NOTE — Telephone Encounter (Signed)
-----   Message from Althea Charon, [place] sent at 07/04/2020 12:55 PM EDT ----- Please refer to ENT for hoarsenss

## 2020-08-14 NOTE — Telephone Encounter (Signed)
Per Dr Deeann Saint office, patient was called multiple times to schedule a new patient appointment. Patient didn't call back to schedule.  I called and left a detailed message for the patient to see if she is still interested.    Thanks

## 2020-08-14 NOTE — Patient Instructions (Incomplete)
Asthma Start Breztri 2 puffs twice a day with spacer to help prevent cough and wheeze.  May use albuterol 2 puffs every 4 hours as needed for cough, wheeze, tightness in chest, or shortness of breath OR may use albuterol 0.083% via nebulizer 1 unit dose every 4 hours as needed  Chronic rhinitis Skin testing was negative on 06/12/2020  Hoarseness Recommend scheduling an appointment with ENT to discuss  Please let us know if this treatment plan is not working well for you Schedule a follow up appointment in  months

## 2020-08-14 NOTE — Telephone Encounter (Signed)
Thank you for trying.

## 2020-08-15 ENCOUNTER — Ambulatory Visit: Payer: BC Managed Care – PPO | Admitting: Family

## 2020-08-29 ENCOUNTER — Ambulatory Visit: Payer: BC Managed Care – PPO | Admitting: Obstetrics and Gynecology

## 2020-12-13 ENCOUNTER — Encounter: Payer: Self-pay | Admitting: Allergy & Immunology

## 2020-12-14 ENCOUNTER — Other Ambulatory Visit: Payer: Self-pay | Admitting: *Deleted

## 2020-12-14 MED ORDER — ALBUTEROL SULFATE HFA 108 (90 BASE) MCG/ACT IN AERS: INHALATION_SPRAY | RESPIRATORY_TRACT | 1 refills | Status: AC

## 2020-12-14 MED ORDER — ALBUTEROL SULFATE HFA 108 (90 BASE) MCG/ACT IN AERS: 2.0000 | INHALATION_SPRAY | Freq: Four times a day (QID) | RESPIRATORY_TRACT | 1 refills | Status: DC | PRN

## 2020-12-26 ENCOUNTER — Encounter: Payer: Self-pay | Admitting: Allergy & Immunology

## 2020-12-26 ENCOUNTER — Telehealth: Payer: Self-pay

## 2020-12-26 NOTE — Telephone Encounter (Signed)
Patient called requesting a referral for sleep study. I am not seeing in her chart where any sleep issues have been discussed. Dr Ernst Bowler how would you like to move forward with the referral to sleep?   Thanks

## 2020-12-27 NOTE — Telephone Encounter (Signed)
Please see telephone contact from 12/26/2020

## 2020-12-27 NOTE — Telephone Encounter (Signed)
Called and left the patient a voicemail to schedule a tele visit to discuss the sleep study referral.

## 2020-12-27 NOTE — Telephone Encounter (Signed)
Why don't we set her up for a quick televisit? I do not see much either in the notes. I need some more details before putting in this referral. I could even call her over the lunch hour.   Salvatore Marvel, MD [facility] [place]

## 2020-12-28 ENCOUNTER — Encounter: Payer: Self-pay | Admitting: Allergy & Immunology

## 2020-12-28 ENCOUNTER — Ambulatory Visit (INDEPENDENT_AMBULATORY_CARE_PROVIDER_SITE_OTHER): Payer: BC Managed Care – PPO | Admitting: Allergy & Immunology

## 2020-12-28 ENCOUNTER — Other Ambulatory Visit: Payer: Self-pay

## 2020-12-28 DIAGNOSIS — J4540 Moderate persistent asthma, uncomplicated: Secondary | ICD-10-CM | POA: Diagnosis not present

## 2020-12-28 DIAGNOSIS — R0602 Shortness of breath: Secondary | ICD-10-CM | POA: Diagnosis not present

## 2020-12-28 DIAGNOSIS — J310 Chronic rhinitis: Secondary | ICD-10-CM

## 2020-12-28 DIAGNOSIS — G4730 Sleep apnea, unspecified: Secondary | ICD-10-CM

## 2020-12-28 NOTE — Progress Notes (Signed)
RE: Carmen Stokes MRN: [id number] DOB: 17-Jan-1976 Date of Telemedicine Visit: 12/28/2020  Referring provider: No ref. provider found Primary care provider: Pcp, No  Chief Complaint: Allergies (Referral for a Sleep apnea test.)   Telemedicine Follow Up Visit via Telephone: I connected with [name] for a follow up on 12/28/20 by telephone and verified that I am speaking with the correct person using two identifiers.   I discussed the limitations, risks, security and privacy concerns of performing an evaluation and management service by telephone and the availability of in person appointments. I also discussed with the patient that there may be a patient responsible charge related to this service. The patient expressed understanding and agreed to proceed.  Patient is at home.  Provider is at the office.  Visit start time: 3:44 PM Visit end time: 4:05 PM Insurance consent/check in by: [facility] consent and medical assistant/nurse: Dr. Darnell Level  History of Present Illness:  She is a 45 y.o. female, who is being followed for asthma as well has chronic rhinitis and hoarseness. Her previous allergy office visit was in March 2022 with Althea Charon, FNP.   In the interim, her husband reported that she is having some issues with stopping breathing. She also snores as well which is bothering her husband. She is waking up the headaches. She is not waking up with energy. She has never had a sleep study.  She has never been on a CPAP.  Asthma/Respiratory Symptom History: She remains on the Breztri two puffs twice daily. Some days are not as good as others. She does use her albuterol nebulizer intermittently, 1-2 times per week. Last week was every day. It does help some when she uses the albuterol.  Allergic Rhinitis Symptom History: She is not on anything for chronic rhinitis.  She has been tested and is negative in the past.  Otherwise, there have been no changes to her past medical  history, surgical history, family history, or social history.  Assessment and Plan:  Basha is a 45 y.o. female with:  SOB (shortness of breath)  Not well controlled moderate persistent asthma  Chronic rhinitis  Sleep apnea   We are going to go ahead and refer her to [facility] for evaluation of sleep disorders.  Referral placed.  Hopefully they will call her next week.  I think she would benefit from sleep evaluation and possible sleep study.  Regarding her asthma, we are going to get some labs in case we need to start an injectable asthma medication.  Diagnostics: None.  Medication List:  Current Outpatient Medications  Medication Sig Dispense Refill   albuterol (PROVENTIL) (2.5 MG/3ML) 0.083% nebulizer solution Take 3 mLs (2.5 mg total) by nebulization every 6 (six) hours as needed for wheezing or shortness of breath. 75 mL 1   albuterol (VENTOLIN HFA) 108 (90 Base) MCG/ACT inhaler 2 puffs every 4-6 hours as needed. 18 g 1   Budeson-Glycopyrrol-Formoterol (BREZTRI AEROSPHERE) 160-9-4.8 MCG/ACT AERO Inhale 2 puffs into the lungs in the morning and at bedtime. 10.9 g 3   buPROPion (WELLBUTRIN XL) 300 MG 24 hr tablet Take 1 tablet (300 mg total) by mouth daily. (Needs to be seen before next refill) (Patient taking differently: Take 300 mg by mouth daily.) 30 tablet 0   ibuprofen (ADVIL) 800 MG tablet Take 1 tablet (800 mg total) by mouth every 6 (six) hours as needed. 30 tablet 1   omeprazole (PRILOSEC) 20 MG capsule TAKE 1 CAPSULE BY MOUTH EVERY DAY.  Needs to be seen for further refills. (Patient taking differently: Take 20 mg by mouth daily. Marland Kitchen) 30 capsule 0   omeprazole (PRILOSEC) 20 MG capsule Take by mouth.     sertraline (ZOLOFT) 100 MG tablet Take 1 tablet (100 mg total) by mouth daily. TAKE 1 TABLET BY MOUTH EVERY DAY (Patient taking differently: Take 100 mg by mouth at bedtime.) 30 tablet 1   sodium chloride (OCEAN) 0.65 % nasal spray Saline Nasal 0.65 % spray  aerosol     No current facility-administered medications for this visit.   Allergies: No Known Allergies I reviewed her past medical history, social history, family history, and environmental history and no significant changes have been reported from previous visits.  Review of Systems  Constitutional:  Positive for fatigue. Negative for activity change, appetite change, chills and diaphoresis.  HENT:  Negative for congestion, postnasal drip, rhinorrhea, sinus pressure and sore throat.   Eyes:  Negative for pain, discharge, redness and itching.  Respiratory:  Positive for cough and shortness of breath. Negative for wheezing and stridor.   Gastrointestinal:  Negative for diarrhea, nausea and vomiting.  Endocrine: Negative for cold intolerance and heat intolerance.  Musculoskeletal:  Negative for arthralgias, joint swelling and myalgias.  Skin:  Negative for rash.  Allergic/Immunologic: Negative for environmental allergies and food allergies.   Objective:  Physical exam not obtained as encounter was done via telephone.   Previous notes and tests were reviewed.  I discussed the assessment and treatment plan with the patient. The patient was provided an opportunity to ask questions and all were answered. The patient agreed with the plan and demonstrated an understanding of the instructions.   The patient was advised to call back or seek an in-person evaluation if the symptoms worsen or if the condition fails to improve as anticipated.  I provided 21 minutes of non-face-to-face time during this encounter.  It was my pleasure to participate in [name]'s care today. Please feel free to contact me with any questions or concerns.   Sincerely,  Valentina Shaggy, MD

## 2021-01-10 NOTE — Progress Notes (Signed)
Patient has been called 1x by [facility].  I called and left a voicemail to remind the patient to contact them to get scheduled.

## 2021-02-05 ENCOUNTER — Other Ambulatory Visit (HOSPITAL_COMMUNITY): Payer: Self-pay | Admitting: General Surgery

## 2021-02-05 ENCOUNTER — Other Ambulatory Visit: Payer: Self-pay | Admitting: General Surgery

## 2021-02-05 DIAGNOSIS — Z6837 Body mass index (BMI) 37.0-37.9, adult: Secondary | ICD-10-CM

## 2021-02-05 DIAGNOSIS — K219 Gastro-esophageal reflux disease without esophagitis: Secondary | ICD-10-CM

## 2021-02-20 ENCOUNTER — Ambulatory Visit (HOSPITAL_COMMUNITY)
Admission: RE | Admit: 2021-02-20 | Discharge: 2021-02-20 | Disposition: A | Discharge status: Home / Self Care | Payer: BC Managed Care – PPO | Source: Ambulatory Visit | Attending: General Surgery | Admitting: General Surgery

## 2021-02-20 ENCOUNTER — Other Ambulatory Visit (HOSPITAL_COMMUNITY): Payer: Self-pay | Admitting: General Surgery

## 2021-02-20 ENCOUNTER — Other Ambulatory Visit: Payer: Self-pay

## 2021-02-20 DIAGNOSIS — K219 Gastro-esophageal reflux disease without esophagitis: Secondary | ICD-10-CM

## 2021-02-20 DIAGNOSIS — Z6837 Body mass index (BMI) 37.0-37.9, adult: Secondary | ICD-10-CM | POA: Diagnosis present

## 2021-02-21 ENCOUNTER — Encounter: Payer: Self-pay | Admitting: Allergy & Immunology

## 2021-02-21 ENCOUNTER — Other Ambulatory Visit: Payer: Self-pay

## 2021-02-21 MED ORDER — BREZTRI AEROSPHERE 160-9-4.8 MCG/ACT IN AERO: 2.0000 | INHALATION_SPRAY | Freq: Two times a day (BID) | RESPIRATORY_TRACT | 3 refills | Status: DC

## 2021-03-28 ENCOUNTER — Institutional Professional Consult (permissible substitution): Payer: BC Managed Care – PPO | Admitting: Neurology

## 2021-04-02 ENCOUNTER — Other Ambulatory Visit: Payer: Self-pay

## 2021-04-02 MED ORDER — BREZTRI AEROSPHERE 160-9-4.8 MCG/ACT IN AERO: 2.0000 | INHALATION_SPRAY | Freq: Two times a day (BID) | RESPIRATORY_TRACT | 3 refills | Status: AC

## 2021-04-11 ENCOUNTER — Encounter: Payer: Self-pay | Admitting: Skilled Nursing Facility1

## 2021-04-11 ENCOUNTER — Encounter: Payer: BC Managed Care – PPO | Attending: General Surgery | Admitting: Skilled Nursing Facility1

## 2021-04-11 ENCOUNTER — Other Ambulatory Visit: Payer: Self-pay

## 2021-04-11 NOTE — Progress Notes (Signed)
Nutrition Assessment for Bariatric Surgery Medical Nutrition Therapy Appt Start Time: 3:04    End Time: 4:04  Patient was seen on 04/11/2021 for Pre-Operative Nutrition Assessment. Letter of approval faxed to [facility] bariatric surgery program coordinator on 04/11/2021.   Referral stated Supervised Weight Loss (SWL) visits needed: 0  Pt completed visits.   Pt has cleared nutrition requirements.   Planned surgery: RYGB Pt expectation of surgery: Weight loss  Pt expectation of dietitian: None stated    NUTRITION ASSESSMENT   Anthropometrics  Start weight at [facility]: 255 lbs (date: 04/11/2021)  Height: 68 in BMI: 38.79 kg/m2     Clinical  Medical hx: anxiety, depression, sleep apnea, hypercholesteremia, GERD Medications: Prilosec, Wellbutrin, Effexor, inhaler   Labs: Total cholesterol: 258, LDL:  154 Notable signs/symptoms: constipation Any previous deficiencies? Yes, Iron  Micronutrient Nutrition Focused Physical Exam: Hair: No issues observed Eyes: No issues observed Mouth: No issues observed Neck: No issues observed Nails: No issues observed Skin: No issues observed  Lifestyle & Dietary Hx  Pt states she wants to reach 175 lbs. Pt states it is not about the number but about lifestyle changes.  Pt states she began taking a probiotic to help with digestion. Pt states she can go three days without a bowel movement. Dietitian educated on microbiota.   Pt states her husband does most of the cooking and is supportive of this change.  Pt states she feels guilt or shame about food once or twice a month due to stress eating. Pt states she stress eats about 4-5 times a month. Dietitian discussed this topic with pt and offered tools to work through this behavior.  24-Hr Dietary Recall First Meal: muffin, egg mcmuffin or egg bites Snack: nuts Second Meal: take out or deli meat wrap + celery sticks  Snack: chocolate  Third Meal: chicken +asparagus or shrimp or  takeout  Snack: cookie or ice cream  Beverages: coffee + oat  creamer+ monk/agave sweetener, water, sprite    Estimated Energy Needs Calories: 1600  NUTRITION DIAGNOSIS  Overweight/obesity ([place]-3.3) related to past poor dietary habits and physical inactivity as evidenced by patient w/ planned RYGB surgery following dietary guidelines for continued weight loss.    NUTRITION INTERVENTION  Nutrition counseling (C-1) and education (E-2) to facilitate bariatric surgery goals.  Educated pt on micronutrient deficiencies post surgery and strategies to mitigate that risk   Pre-Op Goals Reviewed with the Patient Track food and beverage intake (pen and paper, MyFitness Pal, Baritastic app, etc.) Make healthy food choices while monitoring portion sizes Consume 3 meals per day or try to eat every 3-5 hours Avoid concentrated sugars and fried foods Keep sugar & fat in the single digits per serving on food labels Practice CHEWING your food (aim for applesauce consistency) Practice not drinking 15 minutes before, during, and 30 minutes after each meal and snack Avoid all carbonated beverages (ex: soda, sparkling beverages)  Limit caffeinated beverages (ex: coffee, tea, energy drinks) Avoid all sugar-sweetened beverages (ex: regular soda, sports drinks)  Avoid alcohol  Aim for 64-100 ounces of FLUID daily (with at least half of fluid intake being plain water)  Aim for at least 60-80 grams of PROTEIN daily Look for a liquid protein source that contains ?15 g protein and ?5 g carbohydrate (ex: shakes, drinks, shots) Make a list of non-food related activities Physical activity is an important part of a healthy lifestyle so keep it moving! The goal is to reach 150 minutes of exercise per week,  including cardiovascular and weight baring activity. Reduce caffeine intake and try alkaline water  Increase fiber intake by adding fruits and veggies Practice mindful eating and address feelings pt may have about  eating.   *Goals that are bolded indicate the pt would like to start working towards these  Handouts Provided Include  Bariatric Surgery handouts (Nutrition Visits, Pre-Op Goals, Protein Shakes, Vitamins & Minerals)  Learning Style & Readiness for Change Teaching method utilized: Visual & Auditory  Demonstrated degree of understanding via: Teach Back  Readiness Level: Action Barriers to learning/adherence to lifestyle change: Emotional eating     MONITORING & EVALUATION Dietary intake, weekly physical activity, body weight, and pre-op goals reached at next nutrition visit.    Next Steps  Patient is to follow up at [facility] for Pre-Op Class >2 weeks before surgery for further nutrition education.   Pt has completed visits. No further supervised visits required/recomended

## 2021-04-29 ENCOUNTER — Encounter: Payer: BC Managed Care – PPO | Attending: General Surgery | Admitting: Skilled Nursing Facility1

## 2021-04-29 ENCOUNTER — Other Ambulatory Visit: Payer: Self-pay

## 2021-04-29 NOTE — Progress Notes (Signed)
Pre-Operative Nutrition Class:    Patient was seen on 04/29/2021 for Pre-Operative Bariatric Surgery Education at [facility]    Surgery date:  Surgery type: RYGB Start weight at [facility]: 255 Weight today: 261.1  Samples given per MNT protocol. Patient educated on appropriate usage: Ensure max exp: June 19, 2021 Ensure max lot: [id number]  Chewable bariatric advantage: advanced multi EA exp: 08/23 Chewable bariatric advantage: advanced multi EA lot: [id number]  Bariatric advantage calcium citrate exp: 02/23 Bariatric advantage calcium citrate lot: [id number]   The following the learning objectives were met by the patient during this course: Identify Pre-Op Dietary Goals and will begin 2 weeks pre-operatively Identify appropriate sources of fluids and proteins  State protein recommendations and appropriate sources pre and post-operatively Identify Post-Operative Dietary Goals and will follow for 2 weeks post-operatively Identify appropriate multivitamin and calcium sources Describe the need for physical activity post-operatively and will follow MD recommendations State when to call healthcare provider regarding medication questions or post-operative complications When having a diagnosis of diabetes understanding hypoglycemia symptoms and the inclusion of 1 complex carbohydrate per meal  Handouts given during class include: Pre-Op Bariatric Surgery Diet Handout Protein Shake Handout Post-Op Bariatric Surgery Nutrition Handout BELT Program Information Flyer Support Group Information Flyer [facility] Bariatric Supplements Price List  Follow-Up Plan: Patient will follow-up at [facility] 2 weeks post operatively for diet advancement per MD.

## 2021-05-07 ENCOUNTER — Ambulatory Visit: Payer: Self-pay | Admitting: General Surgery

## 2021-05-07 DIAGNOSIS — Z01818 Encounter for other preprocedural examination: Secondary | ICD-10-CM

## 2021-05-07 NOTE — Progress Notes (Signed)
Sent message, via epic in basket, requesting orders in epic from surgeon.  

## 2021-05-13 NOTE — Progress Notes (Addendum)
COVID swab appointment: 05/24/21 @ 0900  COVID Vaccine Completed: yes x1 Date COVID Vaccine completed: Has received booster: COVID vaccine manufacturer: [employer]   Date of COVID positive in last 90 days: no  PCP - [name], DO Cardiologist - n/a  Chest x-ray - 02/20/21 Epic EKG - 02/20/21 Epic Stress Test - 15+ years per pt ECHO - 15+ years ago per pt Cardiac Cath - n/a Pacemaker/ICD device last checked: n/a Spinal Cord Stimulator: n/a  Sleep Study - yes, positive  CPAP - yes every night, will bring mask and tubing  Fasting Blood Sugar - n/a Checks Blood Sugar _____ times a day  Blood Thinner Instructions: n/a Aspirin Instructions: Last Dose:  Activity level: Can go up a flight of stairs and perform activities of daily living without stopping and without symptoms of chest pain. SOB, pt has asthma    Anesthesia review:   Patient denies shortness of breath, fever, cough and chest pain at PAT appointment   Patient verbalized understanding of instructions that were given to them at the PAT appointment. Patient was also instructed that they will need to review over the PAT instructions again at home before surgery.

## 2021-05-13 NOTE — Patient Instructions (Addendum)
DUE TO COVID-19 ONLY ONE VISITOR IS ALLOWED TO COME WITH YOU AND STAY IN THE WAITING ROOM ONLY DURING PRE OP AND PROCEDURE.   **NO VISITORS ARE ALLOWED IN THE SHORT STAY AREA OR RECOVERY ROOM!!**  IF YOU WILL BE ADMITTED INTO THE HOSPITAL YOU ARE ALLOWED ONLY TWO SUPPORT PEOPLE DURING VISITATION HOURS ONLY (7 AM -8PM)   The support person(s) must pass our screening, gel in and out, and wear a mask at all times, including in the patients room. Patients must also wear a mask when staff or their support person are in the room. Visitors GUEST BADGE MUST BE WORN VISIBLY  One adult visitor may remain with you overnight and MUST be in the room by 8 P.M.  No visitors under the age of 69. Any visitor under the age of 75 must be accompanied by an adult.    COVID SWAB TESTING MUST BE COMPLETED ON:  05/24/21 @ 9:00 AM   Site: [facility] Enter: Main Entrance have a seat in the waiting area to the right of main entrance (DO NOT [place]!!!!!) Dial: [phone] to alert staff you have arrived  You are not required to quarantine, however you are required to wear a well-fitted mask when you are out and around people not in your household.  Hand Hygiene often Do NOT share personal items Notify your provider if you are in close contact with someone who has COVID or you develop fever 100.4 or greater, new onset of sneezing, cough, sore throat, shortness of breath or body aches.       Your procedure is scheduled on: 05/28/21   Report to [facility] Main Entrance    Report to admitting at 5:15 AM   Call this number if you have problems the morning of surgery [phone]   MORNING OF SURGERY DRINK:   DRINK 1 G2 drink BEFORE YOU LEAVE HOME, DRINK ALL OF THE  G2 DRINK AT ONE TIME.   NO SOLID FOOD AFTER 600 PM THE NIGHT BEFORE YOUR SURGERY. YOU MAY DRINK CLEAR FLUIDS. THE G2 DRINK YOU DRINK BEFORE YOU LEAVE HOME WILL BE THE LAST FLUIDS YOU DRINK  BEFORE SURGERY.  PAIN IS EXPECTED AFTER SURGERY AND WILL NOT BE COMPLETELY ELIMINATED. AMBULATION AND TYLENOL WILL HELP REDUCE INCISIONAL AND GAS PAIN. MOVEMENT IS KEY!  YOU ARE EXPECTED TO BE OUT OF BED WITHIN 4 HOURS OF ADMISSION TO YOUR PATIENT ROOM.  SITTING IN THE RECLINER THROUGHOUT THE DAY IS IMPORTANT FOR DRINKING FLUIDS AND MOVING GAS THROUGHOUT THE GI TRACT.  COMPRESSION STOCKINGS SHOULD BE WORN [place] UNLESS YOU ARE WALKING.   INCENTIVE SPIROMETER SHOULD BE USED EVERY HOUR WHILE AWAKE TO DECREASE POST-OPERATIVE COMPLICATIONS SUCH AS PNEUMONIA.  WHEN DISCHARGED HOME, IT IS IMPORTANT TO CONTINUE TO WALK EVERY HOUR AND USE THE INCENTIVE SPIROMETER EVERY HOUR.    May have liquids until 4:30 AM day of surgery  CLEAR LIQUID DIET  Foods Allowed                                                                     Foods Excluded  Water, Black Coffee and tea (no milk or creamer)  liquids that you cannot  Plain Jell-O in any flavor  (No red)                                    see through such as: Fruit ices (not with fruit pulp)                                            milk, soups, orange juice              Iced Popsicles (No red)                                                All solid food                                   Apple juices Sports drinks like Gatorade (No red) Lightly seasoned clear broth or consume(fat free) Sugar  Sample Menu Breakfast                                Lunch                                     Supper Cranberry juice                    Beef broth                            Chicken broth Jell-O                                     Grape juice                           Apple juice Coffee or tea                        Jell-O                                      Popsicle                                                Coffee or tea                        Coffee or tea      The day of surgery:  Drink ONE (1) Pre-Surgery G2  at 4:30 AM the morning of surgery. Drink in one sitting. Do not sip.  This drink was given to you during your hospital  pre-op appointment visit. Nothing  else to drink after completing the  Pre-Surgery G2.          If you have questions, please contact your surgeons office.     Oral Hygiene is also important to reduce your risk of infection.                                    Remember - BRUSH YOUR TEETH THE MORNING OF SURGERY WITH YOUR REGULAR TOOTHPASTE   Stop all vitamins and supplements 7 days before surgery   Take these medicines the morning of surgery with A SIP OF WATER: Inhalers, Wellbutrin, Omeprazole, Effexor                              You may not have any metal on your body including hair pins, jewelry, and body piercing             Do not wear make-up, lotions, powders, perfumes, or deodorant  Do not wear nail polish including gel and S&S, artificial/acrylic nails, or any other type of covering on natural nails including finger and toenails. If you have artificial nails, gel coating, etc. that needs to be removed by a nail salon please have this removed prior to surgery or surgery may need to be canceled/ delayed if the surgeon/ anesthesia feels like they are unable to be safely monitored.   Do not shave  48 hours prior to surgery.    Do not bring valuables to the hospital. [place].   Contacts, dentures or bridgework may not be worn into surgery.   Bring small overnight bag day of surgery.              Please read over the following fact sheets you were given: IF YOU HAVE QUESTIONS ABOUT YOUR PRE-OP INSTRUCTIONS PLEASE CALL [facility] - Preparing for Surgery Before surgery, you can play an important role.  Because skin is not sterile, your skin needs to be as free of germs as possible.  You can reduce the number of germs on your skin by washing with CHG (chlorahexidine gluconate) soap before  surgery.  CHG is an antiseptic cleaner which kills germs and bonds with the skin to continue killing germs even after washing. Please DO NOT use if you have an allergy to CHG or antibacterial soaps.  If your skin becomes reddened/irritated stop using the CHG and inform your nurse when you arrive at Short Stay. Do not shave (including legs and underarms) for at least 48 hours prior to the first CHG shower.  You may shave your face/neck.  Please follow these instructions carefully:  1.  Shower with CHG Soap the night before surgery and the  morning of surgery.  2.  If you choose to wash your hair, wash your hair first as usual with your normal  shampoo.  3.  After you shampoo, rinse your hair and body thoroughly to remove the shampoo.                             4.  Use CHG as you would any other liquid soap.  You can apply chg directly to the skin and wash.  Gently  with a scrungie or clean washcloth.  5.  Apply the CHG Soap to your body ONLY FROM THE NECK DOWN.   Do   not use on face/ open                           Wound or open sores. Avoid contact with eyes, ears mouth and   genitals (private parts).                       Wash face,  Genitals (private parts) with your normal soap.             6.  Wash thoroughly, paying special attention to the area where your    surgery  will be performed.  7.  Thoroughly rinse your body with warm water from the neck down.  8.  DO NOT shower/wash with your normal soap after using and rinsing off the CHG Soap.                9.  Pat yourself dry with a clean towel.            10.  Wear clean pajamas.            11.  Place clean sheets on your bed the night of your first shower and do not  sleep with pets. Day of Surgery : Do not apply any lotions/deodorants the morning of surgery.  Please wear clean clothes to the hospital/surgery center.  FAILURE TO FOLLOW THESE INSTRUCTIONS MAY RESULT IN THE CANCELLATION OF YOUR SURGERY  PATIENT  SIGNATURE_________________________________  NURSE SIGNATURE__________________________________  ________________________________________________________________________  WHAT IS A BLOOD TRANSFUSION? Blood Transfusion Information  A transfusion is the replacement of blood or some of its parts. Blood is made up of multiple cells which provide different functions. Red blood cells carry oxygen and are used for blood loss replacement. White blood cells fight against infection. Platelets control bleeding. Plasma helps clot blood. Other blood products are available for specialized needs, such as hemophilia or other clotting disorders. BEFORE THE TRANSFUSION  Who gives blood for transfusions?  Healthy volunteers who are fully evaluated to make sure their blood is safe. This is blood bank blood. Transfusion therapy is the safest it has ever been in the practice of medicine. Before blood is taken from a donor, a complete history is taken to make sure that person has no history of diseases nor engages in risky social behavior (examples are intravenous drug use or sexual activity with multiple partners). The donor's travel history is screened to minimize risk of transmitting infections, such as malaria. The donated blood is tested for signs of infectious diseases, such as HIV and hepatitis. The blood is then tested to be sure it is compatible with you in order to minimize the chance of a transfusion reaction. If you or a relative donates blood, this is often done in anticipation of surgery and is not appropriate for emergency situations. It takes many days to process the donated blood. RISKS AND COMPLICATIONS Although transfusion therapy is very safe and saves many lives, the main dangers of transfusion include:  Getting an infectious disease. Developing a transfusion reaction. This is an allergic reaction to something in the blood you were given. Every precaution is taken to prevent this. The decision to  have a blood transfusion has been considered carefully by your caregiver before blood is given. Blood is not given unless the benefits outweigh the  risks. AFTER THE TRANSFUSION Right after receiving a blood transfusion, you will usually feel much better and more energetic. This is especially true if your red blood cells have gotten low (anemic). The transfusion raises the level of the red blood cells which carry oxygen, and this usually causes an energy increase. The nurse administering the transfusion will monitor you carefully for complications. HOME CARE INSTRUCTIONS  No special instructions are needed after a transfusion. You may find your energy is better. Speak with your caregiver about any limitations on activity for underlying diseases you may have. SEEK MEDICAL CARE IF:  Your condition is not improving after your transfusion. You develop redness or irritation at the intravenous (IV) site. SEEK IMMEDIATE MEDICAL CARE IF:  Any of the following symptoms occur over the next 12 hours: Shaking chills. You have a temperature by mouth above 102 F (38.9 C), not controlled by medicine. Chest, back, or muscle pain. People around you feel you are not acting correctly or are confused. Shortness of breath or difficulty breathing. Dizziness and fainting. You get a rash or develop hives. You have a decrease in urine output. Your urine turns a dark color or changes to pink, red, or brown. Any of the following symptoms occur over the next 10 days: You have a temperature by mouth above 102 F (38.9 C), not controlled by medicine. Shortness of breath. Weakness after normal activity. The white part of the eye turns yellow (jaundice). You have a decrease in the amount of urine or are urinating less often. Your urine turns a dark color or changes to pink, red, or brown. Document Released: 04/04/2000 Document Revised: 06/30/2011 Document Reviewed: 11/22/2007 Exit[employer] Patient Information 2014  [place].  _______________________________________________________________________   Incentive Spirometer  An incentive spirometer is a tool that can help keep your lungs clear and active. This tool measures how well you are filling your lungs with each breath. Taking long deep breaths may help reverse or decrease the chance of developing breathing (pulmonary) problems (especially infection) following: A long period of time when you are unable to move or be active. BEFORE THE PROCEDURE  If the spirometer includes an indicator to show your best effort, your nurse or respiratory therapist will set it to a desired goal. If possible, sit up straight or lean slightly forward. Try not to slouch. Hold the incentive spirometer in an upright position. INSTRUCTIONS FOR USE  Sit on the edge of your bed if possible, or sit up as far as you can in bed or on a chair. Hold the incentive spirometer in an upright position. Breathe out normally. Place the mouthpiece in your mouth and seal your lips tightly around it. Breathe in slowly and as deeply as possible, raising the piston or the ball toward the top of the column. Hold your breath for 3-5 seconds or for as long as possible. Allow the piston or ball to fall to the bottom of the column. Remove the mouthpiece from your mouth and breathe out normally. Rest for a few seconds and repeat Steps 1 through 7 at least 10 times every 1-2 hours when you are awake. Take your time and take a few normal breaths between deep breaths. The spirometer may include an indicator to show your best effort. Use the indicator as a goal to work toward during each repetition. After each set of 10 deep breaths, practice coughing to be sure your lungs are clear. If you have an incision (the cut made at the time of surgery),  support your incision when coughing by placing a pillow or rolled up towels firmly against it. Once you are able to get out of bed, walk around indoors and  cough well. You may stop using the incentive spirometer when instructed by your caregiver.  RISKS AND COMPLICATIONS Take your time so you do not get dizzy or light-headed. If you are in pain, you may need to take or ask for pain medication before doing incentive spirometry. It is harder to take a deep breath if you are having pain. AFTER USE Rest and breathe slowly and easily. It can be helpful to keep track of a log of your progress. Your caregiver can provide you with a simple table to help with this. If you are using the spirometer at home, follow these instructions: [place] IF:  You are having difficultly using the spirometer. You have trouble using the spirometer as often as instructed. Your pain medication is not giving enough relief while using the spirometer. You develop fever of 100.5 F (38.1 C) or higher. SEEK IMMEDIATE MEDICAL CARE IF:  You cough up bloody sputum that had not been present before. You develop fever of 102 F (38.9 C) or greater. You develop worsening pain at or near the incision site. MAKE SURE YOU:  Understand these instructions. Will watch your condition. Will get help right away if you are not doing well or get worse. Document Released: 08/18/2006 Document Revised: 06/30/2011 Document Reviewed: 10/19/2006 [employer] [facility] Patient Information 2014 [place], [place].   ________________________________________________________________________

## 2021-05-14 ENCOUNTER — Other Ambulatory Visit: Payer: Self-pay

## 2021-05-14 ENCOUNTER — Encounter (HOSPITAL_COMMUNITY): Payer: Self-pay

## 2021-05-14 ENCOUNTER — Encounter (HOSPITAL_COMMUNITY)
Admission: RE | Admit: 2021-05-14 | Discharge: 2021-05-14 | Disposition: A | Discharge status: Home / Self Care | Payer: BC Managed Care – PPO | Source: Ambulatory Visit | Attending: General Surgery | Admitting: General Surgery

## 2021-05-14 VITALS — BP 142/92 | HR 93 | Temp 98.3°F | Resp 12 | Ht 67.5 in | Wt 257.8 lb

## 2021-05-14 DIAGNOSIS — Z01812 Encounter for preprocedural laboratory examination: Secondary | ICD-10-CM | POA: Insufficient documentation

## 2021-05-14 DIAGNOSIS — Z01818 Encounter for other preprocedural examination: Secondary | ICD-10-CM

## 2021-05-14 HISTORY — DX: Unspecified asthma, uncomplicated: J45.909

## 2021-05-14 HISTORY — DX: Sleep apnea, unspecified: G47.30

## 2021-05-14 LAB — COMPREHENSIVE METABOLIC PANEL
ALT: 31 U/L (ref 0–44)
AST: 26 U/L (ref 15–41)
Albumin: 4.6 g/dL (ref 3.5–5.0)
Alkaline Phosphatase: 76 U/L (ref 38–126)
Anion gap: 8 (ref 5–15)
BUN: 25 mg/dL — ABNORMAL HIGH (ref 6–20)
CO2: 26 mmol/L (ref 22–32)
Calcium: 9.4 mg/dL (ref 8.9–10.3)
Chloride: 102 mmol/L (ref 98–111)
Creatinine, Ser: 0.97 mg/dL (ref 0.44–1.00)
GFR, Estimated: >60 mL/min (ref >60)
Glucose, Bld: 92 mg/dL (ref 70–99)
Potassium: 4.3 mmol/L (ref 3.5–5.1)
Sodium: 136 mmol/L (ref 135–145)
Total Bilirubin: 0.7 mg/dL (ref 0.3–1.2)
Total Protein: 7.9 g/dL (ref 6.5–8.1)

## 2021-05-14 LAB — CBC WITH DIFFERENTIAL/PLATELET
Abs Immature Granulocytes: 0.03 10*3/uL (ref 0.00–0.07)
Basophils Absolute: 0 10*3/uL (ref 0.0–0.1)
Basophils Relative: 0 %
Eosinophils Absolute: 0.2 10*3/uL (ref 0.0–0.5)
Eosinophils Relative: 2 %
HCT: 38.8 % (ref 36.0–46.0)
Hemoglobin: 12.3 g/dL (ref 12.0–15.0)
Immature Granulocytes: 0 %
Lymphocytes Relative: 24 %
Lymphs Abs: 1.8 10*3/uL (ref 0.7–4.0)
MCH: 27.8 pg (ref 26.0–34.0)
MCHC: 31.7 g/dL (ref 30.0–36.0)
MCV: 87.8 fL (ref 80.0–100.0)
Monocytes Absolute: 0.5 10*3/uL (ref 0.1–1.0)
Monocytes Relative: 7 %
Neutro Abs: 4.8 10*3/uL (ref 1.7–7.7)
Neutrophils Relative %: 67 %
Platelets: 265 10*3/uL (ref 150–400)
RBC: 4.42 MIL/uL (ref 3.87–5.11)
RDW: 14.6 % (ref 11.5–15.5)
WBC: 7.2 10*3/uL (ref 4.0–10.5)
nRBC: 0 % (ref 0.0–0.2)

## 2021-05-15 ENCOUNTER — Ambulatory Visit: Payer: BC Managed Care – PPO | Admitting: Allergy & Immunology

## 2021-05-24 ENCOUNTER — Encounter (HOSPITAL_COMMUNITY)
Admission: RE | Admit: 2021-05-24 | Discharge: 2021-05-24 | Disposition: A | Discharge status: Home / Self Care | Payer: BC Managed Care – PPO | Source: Ambulatory Visit | Attending: General Surgery | Admitting: General Surgery

## 2021-05-24 ENCOUNTER — Other Ambulatory Visit: Payer: Self-pay

## 2021-05-24 DIAGNOSIS — Z01812 Encounter for preprocedural laboratory examination: Secondary | ICD-10-CM | POA: Diagnosis present

## 2021-05-24 DIAGNOSIS — Z20822 Contact with and (suspected) exposure to covid-19: Secondary | ICD-10-CM | POA: Diagnosis not present

## 2021-05-24 LAB — SARS CORONAVIRUS 2 (TAT 6-24 HRS): SARS Coronavirus 2: NEGATIVE

## 2021-05-27 NOTE — Anesthesia Preprocedure Evaluation (Addendum)
Anesthesia Evaluation  Patient identified by MRN, date of birth, ID band Patient awake    Reviewed: Allergy & Precautions, NPO status , Patient's Chart, lab work & pertinent test results  History of Anesthesia Complications (+) PONV and history of anesthetic complications  Airway Mallampati: II  TM Distance: >3 FB Neck ROM: Full    Dental no notable dental hx.    Pulmonary asthma , sleep apnea , former smoker,    Pulmonary exam normal        Cardiovascular negative cardio ROS   Rhythm:Regular Rate:Normal     Neuro/Psych Anxiety Depression negative neurological ROS     GI/Hepatic Neg liver ROS, GERD  Medicated,  Endo/Other  Morbid obesity  Renal/GU negative Renal ROS  negative genitourinary   Musculoskeletal negative musculoskeletal ROS (+)   Abdominal Normal abdominal exam  (+)   Peds  Hematology negative hematology ROS (+)   Anesthesia Other Findings   Reproductive/Obstetrics                            Anesthesia Physical Anesthesia Plan  ASA: 3  Anesthesia Plan: General   Post-op Pain Management: Lidocaine infusion, Tylenol PO (pre-op), Celebrex PO (pre-op) and Ketamine IV   Induction:   PONV Risk Score and Plan: 4 or greater and Ondansetron, Aprepitant, Dexamethasone, Midazolam and Scopolamine patch - Pre-op  Airway Management Planned: Mask and Oral ETT  Additional Equipment: None  Intra-op Plan:   Post-operative Plan: Extubation in OR  Informed Consent: I have reviewed the patients History and Physical, chart, labs and discussed the procedure including the risks, benefits and alternatives for the proposed anesthesia with the patient or authorized representative who has indicated his/her understanding and acceptance.     Dental advisory given  Plan Discussed with: CRNA  Anesthesia Plan Comments: (Lab Results      Component                Value               Date                       WBC                      7.2                 05/14/2021                HGB                      12.3                05/14/2021                HCT                      38.8                05/14/2021                MCV                      87.8                05/14/2021                PLT  265                 05/14/2021           Lab Results      Component                Value               Date                      NA                       136                 05/14/2021                K                        4.3                 05/14/2021                CO2                      26                  05/14/2021                GLUCOSE                  92                  05/14/2021                BUN                      25 (H)              05/14/2021                CREATININE               0.97                05/14/2021                CALCIUM                  9.4                 05/14/2021                GFRNONAA                 >60                 05/14/2021           )       Anesthesia Quick Evaluation

## 2021-05-28 ENCOUNTER — Inpatient Hospital Stay (HOSPITAL_COMMUNITY): Payer: BC Managed Care – PPO | Admitting: Certified Registered Nurse Anesthetist

## 2021-05-28 ENCOUNTER — Encounter (HOSPITAL_COMMUNITY): Payer: Self-pay | Admitting: General Surgery

## 2021-05-28 ENCOUNTER — Inpatient Hospital Stay (HOSPITAL_COMMUNITY)
Admission: RE | Admit: 2021-05-28 | Discharge: 2021-05-29 | DRG: 621 | Disposition: A | Discharge status: Home / Self Care | Payer: BC Managed Care – PPO | Source: Ambulatory Visit | Attending: General Surgery | Admitting: General Surgery

## 2021-05-28 ENCOUNTER — Other Ambulatory Visit: Payer: Self-pay

## 2021-05-28 ENCOUNTER — Encounter (HOSPITAL_COMMUNITY)
Admission: RE | Disposition: A | Discharge status: Home / Self Care | Payer: Self-pay | Source: Ambulatory Visit | Attending: General Surgery

## 2021-05-28 DIAGNOSIS — E781 Pure hyperglyceridemia: Secondary | ICD-10-CM | POA: Diagnosis present

## 2021-05-28 DIAGNOSIS — Z818 Family history of other mental and behavioral disorders: Secondary | ICD-10-CM

## 2021-05-28 DIAGNOSIS — Z01818 Encounter for other preprocedural examination: Secondary | ICD-10-CM

## 2021-05-28 DIAGNOSIS — Z8349 Family history of other endocrine, nutritional and metabolic diseases: Secondary | ICD-10-CM

## 2021-05-28 DIAGNOSIS — E7800 Pure hypercholesterolemia, unspecified: Secondary | ICD-10-CM | POA: Diagnosis present

## 2021-05-28 DIAGNOSIS — Z87891 Personal history of nicotine dependence: Secondary | ICD-10-CM | POA: Diagnosis not present

## 2021-05-28 DIAGNOSIS — K449 Diaphragmatic hernia without obstruction or gangrene: Secondary | ICD-10-CM | POA: Diagnosis present

## 2021-05-28 DIAGNOSIS — Z8261 Family history of arthritis: Secondary | ICD-10-CM

## 2021-05-28 DIAGNOSIS — Z9884 Bariatric surgery status: Secondary | ICD-10-CM

## 2021-05-28 DIAGNOSIS — J45909 Unspecified asthma, uncomplicated: Secondary | ICD-10-CM | POA: Diagnosis present

## 2021-05-28 DIAGNOSIS — Z8249 Family history of ischemic heart disease and other diseases of the circulatory system: Secondary | ICD-10-CM | POA: Diagnosis not present

## 2021-05-28 DIAGNOSIS — Z6839 Body mass index (BMI) 39.0-39.9, adult: Secondary | ICD-10-CM | POA: Diagnosis not present

## 2021-05-28 DIAGNOSIS — K219 Gastro-esophageal reflux disease without esophagitis: Secondary | ICD-10-CM | POA: Diagnosis present

## 2021-05-28 DIAGNOSIS — Z809 Family history of malignant neoplasm, unspecified: Secondary | ICD-10-CM | POA: Diagnosis not present

## 2021-05-28 DIAGNOSIS — G4733 Obstructive sleep apnea (adult) (pediatric): Secondary | ICD-10-CM | POA: Diagnosis present

## 2021-05-28 HISTORY — PX: HIATAL HERNIA REPAIR: SHX195

## 2021-05-28 HISTORY — PX: GASTRIC ROUX-EN-Y: SHX5262

## 2021-05-28 LAB — TYPE AND SCREEN
ABO/RH(D): O POS
Antibody Screen: NEGATIVE

## 2021-05-28 LAB — HEMOGLOBIN AND HEMATOCRIT, BLOOD
HCT: 38.1 % (ref 36.0–46.0)
Hemoglobin: 11.9 g/dL — ABNORMAL LOW (ref 12.0–15.0)

## 2021-05-28 LAB — PREGNANCY, URINE: Preg Test, Ur: NEGATIVE

## 2021-05-28 SURGERY — LAPAROSCOPIC ROUX-EN-Y GASTRIC BYPASS WITH UPPER ENDOSCOPY
Anesthesia: General | Site: Abdomen

## 2021-05-28 MED ORDER — FIBRIN SEALANT 2 ML SINGLE DOSE KIT
2.0000 mL | PACK | Freq: Once | CUTANEOUS | Status: AC
  Administered 2021-05-28: 2 mL via TOPICAL
  Filled 2021-05-28: qty 2

## 2021-05-28 MED ORDER — MIDAZOLAM HCL 2 MG/2ML IJ SOLN
INTRAMUSCULAR | Status: AC
  Filled 2021-05-28: qty 2

## 2021-05-28 MED ORDER — SODIUM CHLORIDE 0.9 % IV SOLN
2.0000 g | INTRAVENOUS | Status: AC
  Administered 2021-05-28: 2 g via INTRAVENOUS
  Filled 2021-05-28: qty 2

## 2021-05-28 MED ORDER — DEXAMETHASONE SODIUM PHOSPHATE 10 MG/ML IJ SOLN
INTRAMUSCULAR | Status: AC
  Filled 2021-05-28: qty 1

## 2021-05-28 MED ORDER — FENTANYL CITRATE (PF) 100 MCG/2ML IJ SOLN
INTRAMUSCULAR | Status: AC
  Filled 2021-05-28: qty 2

## 2021-05-28 MED ORDER — MENTHOL 3 MG MT LOZG: 1.0000 | LOZENGE | OROMUCOSAL | Status: DC | PRN

## 2021-05-28 MED ORDER — FENTANYL CITRATE (PF) 100 MCG/2ML IJ SOLN
INTRAMUSCULAR | Status: DC | PRN
  Administered 2021-05-28 (×3): 50 ug via INTRAVENOUS
  Administered 2021-05-28: 100 ug via INTRAVENOUS
  Administered 2021-05-28: 50 ug via INTRAVENOUS

## 2021-05-28 MED ORDER — SCOPOLAMINE 1 MG/3DAYS TD PT72: 1.0000 | MEDICATED_PATCH | TRANSDERMAL | Status: DC

## 2021-05-28 MED ORDER — SODIUM CHLORIDE (PF) 0.9 % IJ SOLN
INTRAMUSCULAR | Status: DC | PRN
  Administered 2021-05-28: 50 mL

## 2021-05-28 MED ORDER — HEPARIN SODIUM (PORCINE) 5000 UNIT/ML IJ SOLN
5000.0000 [IU] | INTRAMUSCULAR | Status: AC
  Administered 2021-05-28: 5000 [IU] via SUBCUTANEOUS
  Filled 2021-05-28: qty 1

## 2021-05-28 MED ORDER — KETAMINE HCL 50 MG/5ML IJ SOSY
PREFILLED_SYRINGE | INTRAMUSCULAR | Status: AC
  Filled 2021-05-28: qty 5

## 2021-05-28 MED ORDER — APREPITANT 40 MG PO CAPS
40.0000 mg | ORAL_CAPSULE | ORAL | Status: AC
  Administered 2021-05-28: 40 mg via ORAL

## 2021-05-28 MED ORDER — MELATONIN 3 MG PO TABS
3.0000 mg | ORAL_TABLET | Freq: Every day | ORAL | Status: DC
  Administered 2021-05-28: 3 mg via ORAL
  Filled 2021-05-28: qty 1

## 2021-05-28 MED ORDER — DIPHENHYDRAMINE HCL 25 MG PO CAPS: 25.0000 mg | ORAL_CAPSULE | Freq: Four times a day (QID) | ORAL | Status: DC | PRN

## 2021-05-28 MED ORDER — ORAL CARE MOUTH RINSE: 15.0000 mL | Freq: Once | OROMUCOSAL | Status: AC

## 2021-05-28 MED ORDER — ALBUTEROL SULFATE (2.5 MG/3ML) 0.083% IN NEBU: 2.5000 mg | INHALATION_SOLUTION | Freq: Four times a day (QID) | RESPIRATORY_TRACT | Status: DC | PRN

## 2021-05-28 MED ORDER — CHLORHEXIDINE GLUCONATE 0.12 % MT SOLN
15.0000 mL | Freq: Once | OROMUCOSAL | Status: AC
  Administered 2021-05-28: 15 mL via OROMUCOSAL

## 2021-05-28 MED ORDER — DEXAMETHASONE SODIUM PHOSPHATE 4 MG/ML IJ SOLN: 4.0000 mg | INTRAMUSCULAR | Status: DC

## 2021-05-28 MED ORDER — CHLORHEXIDINE GLUCONATE 4 % EX LIQD: 60.0000 mL | Freq: Once | CUTANEOUS | Status: DC

## 2021-05-28 MED ORDER — AMISULPRIDE (ANTIEMETIC) 5 MG/2ML IV SOLN: 10.0000 mg | Freq: Once | INTRAVENOUS | Status: DC | PRN

## 2021-05-28 MED ORDER — PHENYLEPHRINE 40 MCG/ML (10ML) SYRINGE FOR IV PUSH (FOR BLOOD PRESSURE SUPPORT)
PREFILLED_SYRINGE | INTRAVENOUS | Status: DC | PRN
  Administered 2021-05-28: 80 ug via INTRAVENOUS

## 2021-05-28 MED ORDER — BUPIVACAINE LIPOSOME 1.3 % IJ SUSP
INTRAMUSCULAR | Status: DC | PRN
  Administered 2021-05-28: 20 mL

## 2021-05-28 MED ORDER — ACETAMINOPHEN 500 MG PO TABS
1000.0000 mg | ORAL_TABLET | ORAL | Status: DC
  Filled 2021-05-28: qty 2

## 2021-05-28 MED ORDER — SUGAMMADEX SODIUM 500 MG/5ML IV SOLN
INTRAVENOUS | Status: AC
  Filled 2021-05-28: qty 5

## 2021-05-28 MED ORDER — ROCURONIUM BROMIDE 10 MG/ML (PF) SYRINGE
PREFILLED_SYRINGE | INTRAVENOUS | Status: AC
  Filled 2021-05-28: qty 10

## 2021-05-28 MED ORDER — PHENYLEPHRINE HCL-NACL 20-0.9 MG/250ML-% IV SOLN
INTRAVENOUS | Status: AC
  Filled 2021-05-28: qty 250

## 2021-05-28 MED ORDER — MORPHINE SULFATE (PF) 2 MG/ML IV SOLN
1.0000 mg | INTRAVENOUS | Status: DC | PRN
  Administered 2021-05-28: 2 mg via INTRAVENOUS
  Filled 2021-05-28: qty 1

## 2021-05-28 MED ORDER — "VISTASEAL 4 ML SINGLE DOSE KIT "
4.0000 mL | PACK | Freq: Once | CUTANEOUS | Status: AC
  Administered 2021-05-28: 4 mL via TOPICAL
  Filled 2021-05-28: qty 4

## 2021-05-28 MED ORDER — BUPIVACAINE LIPOSOME 1.3 % IJ SUSP
INTRAMUSCULAR | Status: AC
  Filled 2021-05-28: qty 20

## 2021-05-28 MED ORDER — OXYCODONE HCL 5 MG/5ML PO SOLN
5.0000 mg | Freq: Four times a day (QID) | ORAL | Status: DC | PRN
  Administered 2021-05-28 – 2021-05-29 (×2): 5 mg via ORAL
  Filled 2021-05-28 (×2): qty 5

## 2021-05-28 MED ORDER — LACTATED RINGERS IV SOLN: INTRAVENOUS | Status: DC

## 2021-05-28 MED ORDER — SCOPOLAMINE 1 MG/3DAYS TD PT72
1.0000 | MEDICATED_PATCH | TRANSDERMAL | Status: DC
  Administered 2021-05-28: 1.5 mg via TRANSDERMAL
  Filled 2021-05-28: qty 1

## 2021-05-28 MED ORDER — PHENOL 1.4 % MT LIQD
1.0000 | OROMUCOSAL | Status: DC | PRN
  Administered 2021-05-28: 1 via OROMUCOSAL
  Filled 2021-05-28: qty 177

## 2021-05-28 MED ORDER — LIDOCAINE HCL (PF) 2 % IJ SOLN
INTRAMUSCULAR | Status: AC
  Filled 2021-05-28: qty 5

## 2021-05-28 MED ORDER — LIDOCAINE HCL (PF) 2 % IJ SOLN
INTRAMUSCULAR | Status: DC | PRN
  Administered 2021-05-28: 1.5 mg/kg/h via INTRADERMAL

## 2021-05-28 MED ORDER — KCL IN DEXTROSE-NACL 20-5-0.45 MEQ/L-%-% IV SOLN
INTRAVENOUS | Status: DC
  Filled 2021-05-28 (×3): qty 1000

## 2021-05-28 MED ORDER — ACETAMINOPHEN 160 MG/5ML PO SOLN
1000.0000 mg | Freq: Three times a day (TID) | ORAL | Status: DC
  Administered 2021-05-28 – 2021-05-29 (×2): 1000 mg via ORAL
  Filled 2021-05-28 (×2): qty 40.6

## 2021-05-28 MED ORDER — PANTOPRAZOLE SODIUM 40 MG IV SOLR
40.0000 mg | Freq: Every day | INTRAVENOUS | Status: DC
  Administered 2021-05-28: 40 mg via INTRAVENOUS
  Filled 2021-05-28: qty 10

## 2021-05-28 MED ORDER — LACTATED RINGERS IR SOLN
Status: DC | PRN
  Administered 2021-05-28: 1000 mL

## 2021-05-28 MED ORDER — FENTANYL CITRATE PF 50 MCG/ML IJ SOSY
25.0000 ug | PREFILLED_SYRINGE | INTRAMUSCULAR | Status: DC | PRN
  Administered 2021-05-28: 50 ug via INTRAVENOUS

## 2021-05-28 MED ORDER — MIDAZOLAM HCL 5 MG/5ML IJ SOLN
INTRAMUSCULAR | Status: DC | PRN
  Administered 2021-05-28: 2 mg via INTRAVENOUS

## 2021-05-28 MED ORDER — SUGAMMADEX SODIUM 500 MG/5ML IV SOLN
INTRAVENOUS | Status: DC | PRN
  Administered 2021-05-28: 300 mg via INTRAVENOUS

## 2021-05-28 MED ORDER — PROPOFOL 10 MG/ML IV BOLUS
INTRAVENOUS | Status: AC
  Filled 2021-05-28: qty 20

## 2021-05-28 MED ORDER — KETAMINE HCL-SODIUM CHLORIDE 100-0.9 MG/10ML-% IV SOSY
PREFILLED_SYRINGE | INTRAVENOUS | Status: DC | PRN
  Administered 2021-05-28: 10 mg via INTRAVENOUS
  Administered 2021-05-28: 30 mg via INTRAVENOUS

## 2021-05-28 MED ORDER — DEXAMETHASONE SODIUM PHOSPHATE 10 MG/ML IJ SOLN
INTRAMUSCULAR | Status: DC | PRN
  Administered 2021-05-28: 7 mg via INTRAVENOUS

## 2021-05-28 MED ORDER — ONDANSETRON HCL 4 MG/2ML IJ SOLN
INTRAMUSCULAR | Status: DC | PRN
  Administered 2021-05-28: 4 mg via INTRAVENOUS

## 2021-05-28 MED ORDER — PROPOFOL 10 MG/ML IV BOLUS
INTRAVENOUS | Status: DC | PRN
  Administered 2021-05-28: 140 mg via INTRAVENOUS

## 2021-05-28 MED ORDER — SODIUM CHLORIDE 0.9 % IV SOLN
12.5000 mg | Freq: Four times a day (QID) | INTRAVENOUS | Status: DC | PRN
  Filled 2021-05-28: qty 0.5

## 2021-05-28 MED ORDER — ACETAMINOPHEN 500 MG PO TABS
1000.0000 mg | ORAL_TABLET | Freq: Once | ORAL | Status: AC
  Administered 2021-05-28: 1000 mg via ORAL

## 2021-05-28 MED ORDER — HYDRALAZINE HCL 20 MG/ML IJ SOLN: 10.0000 mg | INTRAMUSCULAR | Status: DC | PRN

## 2021-05-28 MED ORDER — BUPIVACAINE LIPOSOME 1.3 % IJ SUSP: 20.0000 mL | Freq: Once | INTRAMUSCULAR | Status: DC

## 2021-05-28 MED ORDER — LIDOCAINE 2% (20 MG/ML) 5 ML SYRINGE
INTRAMUSCULAR | Status: DC | PRN
  Administered 2021-05-28: 100 mg via INTRAVENOUS

## 2021-05-28 MED ORDER — SIMETHICONE 80 MG PO CHEW: 80.0000 mg | CHEWABLE_TABLET | Freq: Four times a day (QID) | ORAL | Status: DC | PRN

## 2021-05-28 MED ORDER — ACETAMINOPHEN 500 MG PO TABS
1000.0000 mg | ORAL_TABLET | Freq: Three times a day (TID) | ORAL | Status: DC
  Administered 2021-05-28: 1000 mg via ORAL
  Filled 2021-05-28 (×2): qty 2

## 2021-05-28 MED ORDER — FENTANYL CITRATE PF 50 MCG/ML IJ SOSY
PREFILLED_SYRINGE | INTRAMUSCULAR | Status: AC
  Filled 2021-05-28: qty 1

## 2021-05-28 MED ORDER — FENTANYL CITRATE PF 50 MCG/ML IJ SOSY
PREFILLED_SYRINGE | INTRAMUSCULAR | Status: AC
  Administered 2021-05-28: 50 ug via INTRAVENOUS
  Filled 2021-05-28: qty 1

## 2021-05-28 MED ORDER — ENOXAPARIN SODIUM 30 MG/0.3ML IJ SOSY
30.0000 mg | PREFILLED_SYRINGE | Freq: Two times a day (BID) | INTRAMUSCULAR | Status: DC
  Administered 2021-05-28 – 2021-05-29 (×2): 30 mg via SUBCUTANEOUS
  Filled 2021-05-28 (×2): qty 0.3

## 2021-05-28 MED ORDER — ROCURONIUM BROMIDE 10 MG/ML (PF) SYRINGE
PREFILLED_SYRINGE | INTRAVENOUS | Status: DC | PRN
  Administered 2021-05-28 (×2): 20 mg via INTRAVENOUS
  Administered 2021-05-28: 70 mg via INTRAVENOUS
  Administered 2021-05-28: 10 mg via INTRAVENOUS

## 2021-05-28 MED ORDER — APREPITANT 40 MG PO CAPS
40.0000 mg | ORAL_CAPSULE | Freq: Once | ORAL | Status: DC
  Filled 2021-05-28: qty 1

## 2021-05-28 MED ORDER — ONDANSETRON HCL 4 MG/2ML IJ SOLN
INTRAMUSCULAR | Status: AC
  Filled 2021-05-28: qty 2

## 2021-05-28 MED ORDER — LIDOCAINE HCL 2 % IJ SOLN
INTRAMUSCULAR | Status: AC
  Filled 2021-05-28: qty 20

## 2021-05-28 MED ORDER — CELECOXIB 200 MG PO CAPS
200.0000 mg | ORAL_CAPSULE | Freq: Once | ORAL | Status: AC
  Administered 2021-05-28: 200 mg via ORAL
  Filled 2021-05-28: qty 1

## 2021-05-28 MED ORDER — ONDANSETRON HCL 4 MG/2ML IJ SOLN: 4.0000 mg | Freq: Four times a day (QID) | INTRAMUSCULAR | Status: DC | PRN

## 2021-05-28 MED ORDER — ENSURE MAX PROTEIN PO LIQD
2.0000 [oz_av] | ORAL | Status: DC
  Administered 2021-05-29 (×4): 2 [oz_av] via ORAL

## 2021-05-28 SURGICAL SUPPLY — 106 items
APPLICATOR COTTON TIP 6 STRL (MISCELLANEOUS) ×1 IMPLANT
APPLICATOR COTTON TIP 6IN STRL (MISCELLANEOUS) ×2
APPLICATOR VISTASEAL 35 (MISCELLANEOUS) ×4 IMPLANT
APPLIER CLIP ROT 13.4 12 LRG (CLIP)
BAG COUNTER SPONGE SURGICOUNT (BAG) IMPLANT
BLADE EXTENDED COATED 6.5IN (ELECTRODE) IMPLANT
BLADE HEX COATED 2.75 (ELECTRODE) ×2 IMPLANT
BLADE SURG SZ11 CARB STEEL (BLADE) ×2 IMPLANT
BNDG ADH 1X3 SHEER STRL LF (GAUZE/BANDAGES/DRESSINGS) IMPLANT
CABLE HIGH FREQUENCY MONO STRZ (ELECTRODE) IMPLANT
CHLORAPREP W/TINT 26 (MISCELLANEOUS) ×4 IMPLANT
CLIP APPLIE ROT 13.4 12 LRG (CLIP) IMPLANT
CLIP SUT LAPRA TY ABSORB (SUTURE) ×4 IMPLANT
COVER MAYO STAND STRL (DRAPES) IMPLANT
CUTTER FLEX LINEAR 45M (STAPLE) IMPLANT
DEVICE SUT QUICK LOAD TK 5 (STAPLE) ×1 IMPLANT
DEVICE SUT TI-KNOT TK 5X26 (MISCELLANEOUS) ×1 IMPLANT
DEVICE SUTURE ENDOST 10MM (ENDOMECHANICALS) ×2 IMPLANT
DRAIN PENROSE 0.25X18 (DRAIN) ×2 IMPLANT
DRAIN PENROSE 0.5X18 (DRAIN) ×2 IMPLANT
DRAPE LAPAROSCOPIC ABDOMINAL (DRAPES) ×2 IMPLANT
DRAPE UTILITY XL STRL (DRAPES) ×2 IMPLANT
DRAPE WARM FLUID 44X44 (DRAPES) IMPLANT
DRSG TEGADERM 2-3/8X2-3/4 SM (GAUZE/BANDAGES/DRESSINGS) ×7 IMPLANT
ELECT REM PT RETURN 15FT ADLT (MISCELLANEOUS) ×2 IMPLANT
GAUZE 4X4 16PLY ~~LOC~~+RFID DBL (SPONGE) ×2 IMPLANT
GAUZE SPONGE 2X2 8PLY STRL LF (GAUZE/BANDAGES/DRESSINGS) ×1 IMPLANT
GAUZE SPONGE 4X4 12PLY STRL (GAUZE/BANDAGES/DRESSINGS) ×2 IMPLANT
GLOVE SRG 8 PF TXTR STRL LF DI (GLOVE) ×2 IMPLANT
GLOVE SURG ENC TEXT LTX SZ7.5 (GLOVE) ×2 IMPLANT
GLOVE SURG NEOPR MICRO LF SZ8 (GLOVE) ×2 IMPLANT
GLOVE SURG POLY ORTHO LF SZ7.5 (GLOVE) ×4 IMPLANT
GLOVE SURG UNDER POLY LF SZ7 (GLOVE) ×2 IMPLANT
GLOVE SURG UNDER POLY LF SZ8 (GLOVE) ×2
GOWN STRL REUS W/TWL LRG LVL3 (GOWN DISPOSABLE) ×2 IMPLANT
GOWN STRL REUS W/TWL XL LVL3 (GOWN DISPOSABLE) ×8 IMPLANT
HANDLE SUCTION POOLE (INSTRUMENTS) IMPLANT
IRRIG SUCT STRYKERFLOW 2 WTIP (MISCELLANEOUS) ×2
IRRIGATION SUCT STRKRFLW 2 WTP (MISCELLANEOUS) ×1 IMPLANT
KIT BASIN OR (CUSTOM PROCEDURE TRAY) ×2 IMPLANT
KIT GASTRIC LAVAGE 34FR ADT (SET/KITS/TRAYS/PACK) ×2 IMPLANT
KIT TURNOVER KIT A (KITS) IMPLANT
MARKER SKIN DUAL TIP RULER LAB (MISCELLANEOUS) ×2 IMPLANT
MAT PREVALON FULL STRYKER (MISCELLANEOUS) ×2 IMPLANT
NDL SPNL 22GX3.5 QUINCKE BK (NEEDLE) ×1 IMPLANT
NEEDLE SPNL 22GX3.5 QUINCKE BK (NEEDLE) ×2 IMPLANT
NS IRRIG 1000ML POUR BTL (IV SOLUTION) ×2 IMPLANT
PACK CARDIOVASCULAR III (CUSTOM PROCEDURE TRAY) ×2 IMPLANT
PACK GENERAL/GYN (CUSTOM PROCEDURE TRAY) ×1 IMPLANT
PENCIL SMOKE EVACUATOR (MISCELLANEOUS) IMPLANT
RELOAD 45 VASCULAR/THIN (ENDOMECHANICALS) IMPLANT
RELOAD ENDO STITCH 2.0 (ENDOMECHANICALS) ×9
RELOAD STAPLE 45 2.5 WHT GRN (ENDOMECHANICALS) IMPLANT
RELOAD STAPLE 45 3.5 BLU ETS (ENDOMECHANICALS) IMPLANT
RELOAD STAPLE 60 2.6 WHT THN (STAPLE) ×2 IMPLANT
RELOAD STAPLE 60 3.6 BLU REG (STAPLE) ×2 IMPLANT
RELOAD STAPLE 60 3.8 GOLD REG (STAPLE) ×1 IMPLANT
RELOAD STAPLE TA45 3.5 REG BLU (ENDOMECHANICALS) IMPLANT
RELOAD STAPLER BLUE 60MM (STAPLE) ×3 IMPLANT
RELOAD STAPLER GOLD 60MM (STAPLE) ×1 IMPLANT
RELOAD STAPLER WHITE 60MM (STAPLE) ×2 IMPLANT
RELOAD SUT SNGL STCH ABSRB 2-0 (ENDOMECHANICALS) ×5 IMPLANT
RELOAD SUT SNGL STCH BLK 2-0 (ENDOMECHANICALS) ×4 IMPLANT
SCISSORS LAP 5X45 EPIX DISP (ENDOMECHANICALS) ×2 IMPLANT
SET TUBE SMOKE EVAC HIGH FLOW (TUBING) ×2 IMPLANT
SHEARS HARMONIC ACE PLUS 45CM (MISCELLANEOUS) ×2 IMPLANT
SLEEVE XCEL OPT CAN 5 100 (ENDOMECHANICALS) ×2 IMPLANT
SOL ANTI FOG 6CC (MISCELLANEOUS) ×1 IMPLANT
SOLUTION ANTI FOG 6CC (MISCELLANEOUS) ×1
SPONGE GAUZE 2X2 STER 10/PKG (GAUZE/BANDAGES/DRESSINGS) ×1
SPONGE T-LAP 18X18 ~~LOC~~+RFID (SPONGE) IMPLANT
STAPLER ECHELON BIOABSB 60 FLE (MISCELLANEOUS) IMPLANT
STAPLER ECHELON LONG 60 440 (INSTRUMENTS) ×2 IMPLANT
STAPLER RELOAD BLUE 60MM (STAPLE) ×6
STAPLER RELOAD GOLD 60MM (STAPLE) ×2
STAPLER RELOAD WHITE 60MM (STAPLE) ×4
STAPLER VISISTAT 35W (STAPLE) ×2 IMPLANT
STRIP CLOSURE SKIN 1/2X4 (GAUZE/BANDAGES/DRESSINGS) ×7 IMPLANT
SUCTION POOLE HANDLE (INSTRUMENTS)
SURGILUBE 2OZ TUBE FLIPTOP (MISCELLANEOUS) ×2 IMPLANT
SUT MNCRL AB 4-0 PS2 18 (SUTURE) ×2 IMPLANT
SUT PDS AB 1 CTX 36 (SUTURE) IMPLANT
SUT RELOAD ENDO STITCH 2 48X1 (ENDOMECHANICALS) ×5
SUT RELOAD ENDO STITCH 2.0 (ENDOMECHANICALS) ×4
SUT SILK 2 0 (SUTURE)
SUT SILK 2 0 SH CR/8 (SUTURE) IMPLANT
SUT SILK 2-0 18XBRD TIE 12 (SUTURE) IMPLANT
SUT SILK 3 0 (SUTURE)
SUT SILK 3 0 SH CR/8 (SUTURE) IMPLANT
SUT SILK 3-0 18XBRD TIE 12 (SUTURE) IMPLANT
SUT SURGIDAC NAB ES-9 0 48 120 (SUTURE) ×1 IMPLANT
SUT VIC AB 2-0 SH 27 (SUTURE) ×1
SUT VIC AB 2-0 SH 27X BRD (SUTURE) ×1 IMPLANT
SUT VICRYL 2 0 18  UND BR (SUTURE)
SUT VICRYL 2 0 18 UND BR (SUTURE) IMPLANT
SUTURE RELOAD END STTCH 2 48X1 (ENDOMECHANICALS) ×5 IMPLANT
SUTURE RELOAD ENDO STITCH 2.0 (ENDOMECHANICALS) ×4 IMPLANT
SYR 20ML LL LF (SYRINGE) ×4 IMPLANT
TOWEL OR 17X26 10 PK STRL BLUE (TOWEL DISPOSABLE) ×4 IMPLANT
TOWEL OR NON WOVEN STRL DISP B (DISPOSABLE) ×2 IMPLANT
TRAY FOLEY MTR SLVR 16FR STAT (SET/KITS/TRAYS/PACK) IMPLANT
TROCAR BLADELESS OPT 5 100 (ENDOMECHANICALS) ×2 IMPLANT
TROCAR UNIVERSAL OPT 12M 100M (ENDOMECHANICALS) ×6 IMPLANT
TROCAR XCEL 12X100 BLDLESS (ENDOMECHANICALS) ×2 IMPLANT
TUBING CONNECTING 10 (TUBING) ×4 IMPLANT
YANKAUER SUCT BULB TIP NO VENT (SUCTIONS) IMPLANT

## 2021-05-28 NOTE — Progress Notes (Signed)

## 2021-05-28 NOTE — Op Note (Signed)
Carmen Stokes [id number] 1975/05/18. 05/28/2021  Preoperative diagnosis:  Obesity BMI 39  Gastroesophageal reflux disease, unspecified whether esophagitis present  Obstructive sleep apnea  Hypercholesteremia  Postoperative  diagnosis:  1. Same + sliding hiatal hernia + enlarged mesenteric lymph nodes  Surgical procedure: Laparoscopic Roux-en-Y gastric bypass (ante-colic, ante-gastric) with sliding hiatal hernia repair; laparoscopic small bowel mesenteric lymph node excisional biopsy;  upper endoscopy  Surgeon: Gayland Curry, M.D. FACS  Asst.: Louanna Raw MD   Anesthesia: General plus exparel/marcaine mix  Complications: None   Specimen:  small bowel mesenteric lymph node - fresh  EBL: Minimal   Drains: None   Disposition: PACU in good condition   Indications for procedure: 46 y.o. yo female with morbid obesity who has been unsuccessful at sustained weight loss. The patient's comorbidities are listed above. We discussed the risk and benefits of surgery including but not limited to anesthesia risk, bleeding, infection, blood clot formation, anastomotic leak, anastomotic stricture, ulcer formation, death, respiratory complications, intestinal blockage, internal hernia, gallstone formation, vitamin and nutritional deficiencies, injury to surrounding structures, failure to lose weight and mood changes.   Description of procedure: Patient is brought to the operating room and general anesthesia induced. The patient had received preoperative broad-spectrum IV antibiotics and subcutaneous heparin. The abdomen was widely sterilely prepped with Chloraprep and draped. Patient timeout was performed and correct patient and procedure confirmed. Access was obtained with a 12 mm Optiview trocar in the left upper quadrant and pneumoperitoneum established without difficulty. Under direct vision 12 mm trocars were placed laterally in the right upper quadrant, right upper quadrant midclavicular  line, and to the left and above the umbilicus for the camera port. A 5 mm trocar was placed laterally in the left upper quadrant.  Exparel/marcaine mix was infiltrated in bilateral lateral abdominal walls as a TAP block for postoperative pain relief.  The omentum was brought into the upper abdomen and the transverse mesocolon elevated and the ligament of Treitz clearly identified. A 50 cm biliopancreatic limb was then carefully measured from the ligament of Treitz. The small intestine was divided at this point with a single firing of the white load linear stapler.  After dividing the small bowel we realized that the patient had several enlarged lymph nodes in the small bowel mesentery.  I inspected the liver.  There is no evidence any abnormality in the left or right hepatic lobe.  There may been some hepatic steatosis.  I ran the remaining small bowel.  There is no signs of masses within the small bowel.  There are some scattered enlarged lymph nodes.  These were primarily in the proximal small bowel mesentery.  Her preoperative labs were normal.  I decided to proceed with the procedure.  A Penrose drain was sutured to the end of the Roux-en-Y limb for later identification. A 100 cm Roux-en-Y limb was then carefully measured. At this point a side-to-side anastomosis was created between the Roux limb and the end of the biliopancreatic limb. This was accomplished with a single firing of the 60 mm white load linear stapler. The common enterotomy was closed with a running 2-0 Vicryl begun at either end of the enterotomy and tied centrally. Vistaseal  tissue sealant was placed over the anastomosis. The mesenteric defect was then closed with running 2-0 silk. The omentum was then divided with the harmonic scalpel up towards the transverse colon to allow mobility of the Roux limb toward the gastric pouch. The patient was then placed in steep reversed  Trendelenburg. Through a 5 mm subxiphoid site the [facility] retractor  was placed and the left lobe of the liver elevated with excellent exposure of the upper stomach and hiatus. The angle of Hiss was then mobilized with the harmonic scalpel.   The patient has GERD.  Her preoperative upper GI showed moderate GERD with possible small sliding hiatal hernia.  I decided to dissect out the hiatus.  There is no obvious anterior dimple.  The gastropathic ligament was incised.  There was a fair amount of fat tissue around the stomach.  The right crus was identified.  I incised the peritoneum just slightly medial to it.  Identified the left crus of the diaphragm.  There is a small sliding hiatal hernia.  I was able to ensure that the entire stomach and 2 cm of the esophagus was within the abdominal cavity.  I then reapproximated the left and right crura with a single 0 Surgidac suture using the Endo Stitch device secured with a titanium tie knot.  It was snug at this point.  A 5 cm gastric pouch was then carefully measured along the lesser curve of the stomach. Dissection was carried along the lesser curve at this point with the Harmonic scalpel working carefully back toward the lesser sac at right angles to the lesser curve. The free lesser sac was then entered. After being sure all tubes were removed from the stomach an initial firing of the gold load 60 mm linear stapler was fired at right angles across the lesser curve for about 4 cm. The gastric pouch was further mobilized posteriorly and then the pouch was completed with 3 further firings of the 60 mm blue load linear stapler up through the previously dissected angle of His. It was ensured that the pouch was completely mobilized away from the gastric remnant. This created a nice tubular 4-5 cm gastric pouch. The Roux limb was then brought up in an antecolic fashion with the candycane facing to the patient's left without undue tension. The gastrojejunostomy was created with an initial posterior row of 2-0 Vicryl between the Roux limb  and the staple line of the gastric pouch. Enterotomies were then made in the gastric pouch and the Roux limb with the harmonic scalpel and at approximately 2-2-1/2 cm anastomosis was created with a single firing of the 47mm blue load linear stapler. The staple line was inspected and was intact without bleeding. The common enterotomy was then closed with running 2-0 Vicryl begun at either end and tied centrally. The Ewall tube was then easily passed through the anastomosis and an outer anterior layer of running 2-0 Vicryl was placed.   I reinspected the small bowel.  Given that there were several enlarged lymph nodes I decided to perform a biopsy.  Some of these enlarged lymph nodes were in the Roux limb mesentery.  I was able to lift up 1 and enucleated with a combination of EndoShears along with harmonic scalpel.  There was no injury to the actual blood supply in the Roux limb mesentery.  It was brought out.  It was going to be sent as a fresh specimen.  there was no bleeding from the biopsy site.  The Ewald tube was removed. With the outlet of the gastrojejunostomy clamped and under saline irrigation the assistant performed upper endoscopy and with the gastric pouch tensely distended with air-there was no evidence of leak on this test. The pouch was desufflated. The Terance Hart defect was closed with running 2-0 silk. The abdomen  was inspected for any evidence of bleeding or bowel injury and everything looked fine. The Nathanson retractor was removed under direct vision after coating the anastomosis with vistaseal tissue sealant as well as a lymph node excisional biopsy site and the Roux limb mesentery.  All CO2 was evacuated and trochars removed. Skin incisions were closed with 4-0 monocryl in a subcuticular fashion followed by steri-strips and bandages. Sponge needle and instrument counts were correct. The patient was taken to the PACU in good condition.    Leighton Ruff. Redmond Pulling, MD, FACS General, Bariatric, &  Minimally Invasive Surgery [facility], [place]

## 2021-05-28 NOTE — Anesthesia Postprocedure Evaluation (Signed)
Anesthesia Post Note  Patient: Carmen Stokes  Procedure(s) Performed: LAPAROSCOPIC ROUX-EN-Y GASTRIC BYPASS WITH UPPER ENDOSCOPY, SMALL BOWEL MESSENTERIC LYMPH NODE BIOPSY (Abdomen) HERNIA REPAIR HIATAL (Abdomen)     Patient location during evaluation: PACU Anesthesia Type: General Level of consciousness: awake and alert Pain management: pain level controlled Vital Signs Assessment: post-procedure vital signs reviewed and stable Respiratory status: spontaneous breathing, nonlabored ventilation, respiratory function stable and patient connected to nasal cannula oxygen Cardiovascular status: blood pressure returned to baseline and stable Postop Assessment: no apparent nausea or vomiting Anesthetic complications: no   No notable events documented.  Last Vitals:  Vitals:   05/28/21 1130 05/28/21 1145  BP: (!) 154/93 (!) 144/89  Pulse: 99 94  Resp: 11 16  Temp:  36.4 C  SpO2: 98% 100%    Last Pain:  Vitals:   05/28/21 1145  TempSrc: Oral  PainSc:                  March Rummage Zaydin Billey

## 2021-05-28 NOTE — Progress Notes (Signed)
°  Transition of Care [facility]) Screening Note   Patient Details  Name: Carmen Stokes Date of Birth: 1975-07-21   Transition of Care [facility]) CM/SW Contact:    Lennart Pall, LCSW Phone Number: 05/28/2021, 1:19 PM    Transition of Care Department [facility]) has reviewed patient and no TOC needs have been identified at this time. We will continue to monitor patient advancement through interdisciplinary progression rounds. If new patient transition needs arise, please place a TOC consult.

## 2021-05-28 NOTE — H&P (Signed)
CC: here for surgery  Requesting provider: n/a  HPI: Carmen Stokes is an 46 y.o. female who is here for lap roux en y gastric bypass with poss hiatal hernia repair. Last seen in clinic in mid dec. Denies changes since seen. No f/c/n/v/d/c. Cpap going better  Carmen Stokes is a 46 y.o. female who is seen today for long-term follow-up regarding her class II obesity with associated comorbidities.. I initially met her in October. Her comorbidities include asthma, GERD, sleep apnea and now hypercholesterolemia with hypertriglyceridemia  She states that since her visit she was formally diagnosed with sleep apnea and started on CPAP. She states that is going okay. She initially was interested in sleeve gastrectomy but we decided to see what her upper GI showed to determine whether or not to proceed with sleeve versus Roux-en-Y gastric bypass since she was already on medication for heartburn. She states her heartburn is well controlled on medication however her upper GI did show small hiatal hernia with moderate GERD. Otherwise no medical changes. She did complete her labs which are discussed below  Past Medical History:  Diagnosis Date   Allergy    Anxiety    Asthma    Depression    GERD (gastroesophageal reflux disease)    PONV (postoperative nausea and vomiting)    Sleep apnea     Past Surgical History:  Procedure Laterality Date   DILITATION & CURRETTAGE/HYSTROSCOPY WITH NOVASURE ABLATION N/A 12/02/2018   Procedure: DILATATION & CURETTAGE/HYSTEROSCOPY WITH NOVASURE ABLATION;  Surgeon: [name], DO;  Location: [place];  Service: Gynecology;  Laterality: N/A;   LAPAROSCOPIC TUBAL LIGATION Bilateral 12/02/2018   Procedure: LAPAROSCOPIC TUBAL LIGATION;  Surgeon: [name], DO;  Location: [place];  Service: Gynecology;  Laterality: Bilateral;   LAPAROSCOPIC VAGINAL HYSTERECTOMY WITH SALPINGECTOMY Bilateral 01/16/2020   Procedure: LAPAROSCOPIC  ASSISTED VAGINAL HYSTERECTOMY WITH SALPINGECTOMY;  Surgeon: [name], DO;  Location: [place];  Service: Gynecology;  Laterality: Bilateral;   SEPTOPLASTY  2020   SINOSCOPY     WISDOM TOOTH EXTRACTION      Family History  Problem Relation Age of Onset   Arthritis Mother    Cancer Mother    Thyroid disease Mother    Depression Father    Hyperlipidemia Brother    Hypertension Brother    Thyroid disease Maternal Aunt     Social:  reports that she quit smoking about 10 years ago. Her smoking use included cigarettes. She has never used smokeless tobacco. She reports that she does not drink alcohol and does not use drugs.  Allergies: No Known Allergies  Medications: I have reviewed the patient's current medications.   ROS - all of the below systems have been reviewed with the patient and positives are indicated with bold text General: chills, fever or night sweats Eyes: blurry vision or double vision ENT: epistaxis or sore throat Allergy/Immunology: itchy/watery eyes or nasal congestion Hematologic/Lymphatic: bleeding problems, blood clots or swollen lymph nodes Endocrine: temperature intolerance or unexpected weight changes Breast: new or changing breast lumps or nipple discharge Resp: cough, shortness of breath, or wheezing CV: chest pain or dyspnea on exertion GI: as per HPI GU: dysuria, trouble voiding, or hematuria MSK: joint pain or joint stiffness Neuro: TIA or stroke symptoms Derm: pruritus and skin lesion changes Psych: anxiety and depression  PE Blood pressure (!) 143/80, pulse 87, temperature 99.1 F (37.3 C), temperature source Oral, resp. rate 16, weight 113.4 kg, last menstrual period 12/31/2019,  SpO2 97 %. Constitutional: NAD; conversant; no deformities Eyes: Moist conjunctiva; no lid lag; anicteric; PERRL Neck: Trachea midline; no thyromegaly Lungs: Normal respiratory effort; no tactile fremitus CV: RRR; no palpable thrills; no pitting  edema GI: Abd soft, nt, nd; no palpable hepatosplenomegaly MSK: Normal gait; no clubbing/cyanosis Psychiatric: Appropriate affect; alert and oriented x3 Lymphatic: No palpable cervical or axillary lymphadenopathy Skin:no rash  Results for orders placed or performed during the hospital encounter of 05/28/21 (from the past 48 hour(s))  Pregnancy, urine STAT morning of surgery     Status: None   Collection Time: 05/28/21  5:36 AM  Result Value Ref Range   Preg Test, Ur NEGATIVE NEGATIVE    Comment:        THE SENSITIVITY OF THIS METHODOLOGY IS >20 mIU/mL. Performed at [facility] [place] [address] [place], [place] [address]     No results found.  Imaging: reviewed  A/P: Carmen Stokes is an 46 y.o. female with  Class 2 severe obesity due to excess calories with serious comorbidity and body mass index (BMI) of 37.0 to 37.9 in adult (CMS-HCC)  Gastroesophageal reflux disease, unspecified whether esophagitis present  Obstructive sleep apnea  Hypercholesteremia  To or for LRYGB with poss HHR IV abx Subcu heparin ERAS  All questions asked and answered   Leighton Ruff. Redmond Pulling, MD, FACS General, Bariatric, & Minimally Invasive Surgery [facility], [place]

## 2021-05-28 NOTE — Transfer of Care (Signed)
Immediate Anesthesia Transfer of Care Note  Patient: Carmen Stokes  Procedure(s) Performed: LAPAROSCOPIC ROUX-EN-Y GASTRIC BYPASS WITH UPPER ENDOSCOPY, SMALL BOWEL MESSENTERIC LYMPH NODE BIOPSY (Abdomen) HERNIA REPAIR HIATAL (Abdomen)  Patient Location: PACU  Anesthesia Type:General  Level of Consciousness: awake, drowsy and patient cooperative  Airway & Oxygen Therapy: Patient Spontanous Breathing and Patient connected to face mask oxygen  Post-op Assessment: Report given to RN and Post -op Vital signs reviewed and stable  Post vital signs: Reviewed and stable  Last Vitals:  Vitals Value Taken Time  BP 150/96 05/28/21 1012  Temp    Pulse 90 05/28/21 1014  Resp 10 05/28/21 1014  SpO2 100 % 05/28/21 1014  Vitals shown include unvalidated device data.  Last Pain:  Vitals:   05/28/21 0628  TempSrc: Oral  PainSc:          Complications: No notable events documented.

## 2021-05-28 NOTE — Anesthesia Procedure Notes (Signed)
Procedure Name: Intubation Date/Time: 05/28/2021 7:44 AM Performed by: West Pugh, CRNA Pre-anesthesia Checklist: Patient identified, Emergency Drugs available, Suction available, Patient being monitored and Timeout performed Patient Re-evaluated:Patient Re-evaluated prior to induction Oxygen Delivery Method: Circle system utilized Preoxygenation: Pre-oxygenation with 100% oxygen Induction Type: IV induction Ventilation: Mask ventilation without difficulty Laryngoscope Size: Mac and 3 Grade View: Grade I Tube type: Oral Tube size: 7.0 mm Number of attempts: 1 Airway Equipment and Method: Stylet Placement Confirmation: ETT inserted through vocal cords under direct vision, positive ETCO2, CO2 detector and breath sounds checked- equal and bilateral Secured at: 21 cm Tube secured with: Tape Dental Injury: Teeth and Oropharynx as per pre-operative assessment

## 2021-05-28 NOTE — Op Note (Signed)
° °  Patient: Carmen Stokes (1976/02/05, [id number])  Date of Surgery: 05/28/2021   Preoperative Diagnosis: morbid obesity   Postoperative Diagnosis: morbid obesity   Surgical Procedure: Upper Endoscopy   Surgeon: Louanna Raw, MD  Anesthesiologist: Darral Dash, DO CRNA: Josephine Igo, CRNA; West Pugh, CRNA   Anesthesia: General   Fluids:  Total I/O In: 1100 [I.V.:1000; IV [id number]:169] Out: -   Complications: None  Drains:  None  Specimen: None   Indications for Procedure: Carmen Stokes is a 46 y.o. female undergoing robotic gastric bypass and an EGD was requested to evaluate foregut anatomy intraoperatively.  Description of Procedure: During the procedure, I scrubbed out and obtained the Olympus endoscope. I gently placed endoscope in the patient's oropharynx and gently glided it down the esophagus without any difficulty under direct visualization.  The scope was advanced as far as the roux limb and then slowly withdrawn to inspect the foregut anatomy.  Dr. Redmond Pulling had placed saline in the upper abdomen and all staple lines were submerged to ensure no air leak. There was no evidence of bubbles. There was no evidence of intraluminal bleeding and the mucosa appeared healthy.  The lumen was widely patent without evidence of stricture.  The intraluminal insufflation was decompressed. The scope was withdrawn. The patient tolerated this portion of the procedure well. Please see Dr Dois Davenport operative note for details regarding the remainder of the procedure.    Louanna Raw, MD General, Bariatric, & Minimally Invasive Surgery [facility], [place]

## 2021-05-28 NOTE — Progress Notes (Signed)
PHARMACY CONSULT FOR:  Risk Assessment for Post-Discharge VTE Following Bariatric Surgery  Post-Discharge VTE Risk Assessment: This patient's probability of 30-day post-discharge VTE is increased due to the factors marked:  Sleeve gastrectomy   Liver disorder (transplant, cirrhosis, or nonalcoholic steatohepatitis)   Hx of VTE   Hemorrhage requiring transfusion   GI perforation, leak, or obstruction   ====================================================    Female    Age >/=60 years    BMI >/=50 kg/m2    CHF    Dyspnea at Rest    Paraplegia  x  Non-gastric-band surgery    Operation Time >/=3 hr    Return to OR     Length of Stay >/= 3 d   Hypercoagulable condition   Significant venous stasis      Predicted probability of 30-day post-discharge VTE: 0.16%  Other patient-specific factors to consider:   Recommendation for Discharge: No pharmacologic prophylaxis post-discharge   Carmen Stokes is a 46 y.o. female who underwent Roux-en-Y gastric bypass, hiatal hernia repair; laparoscopic small bowel mesenteric lymph node excisional biopsy; upper endoscopy on 05/28/2021   Case start: 0803 Case end: 0956   No Known Allergies  Patient Measurements: Weight: 113.4 kg (250 lb) Body mass index is 38.58 kg/m.  No results for input(s): WBC, HGB, HCT, PLT, APTT, CREATININE, LABCREA, CREATININE, CREAT24HRUR, MG, PHOS, ALBUMIN, PROT, ALBUMIN, AST, ALT, ALKPHOS, BILITOT, BILIDIR, IBILI in the last 72 hours. Estimated Creatinine Clearance: 93.9 mL/min (by C-G formula based on SCr of 0.97 mg/dL).    Past Medical History:  Diagnosis Date   Allergy    Anxiety    Asthma    Depression    GERD (gastroesophageal reflux disease)    PONV (postoperative nausea and vomiting)    Sleep apnea      Medications Prior to Admission  Medication Sig Dispense Refill Last Dose   albuterol (PROVENTIL) (2.5 MG/3ML) 0.083% nebulizer solution Take 3 mLs (2.5 mg total) by nebulization every 6 (six)  hours as needed for wheezing or shortness of breath. 75 mL 1    albuterol (VENTOLIN HFA) 108 (90 Base) MCG/ACT inhaler 2 puffs every 4-6 hours as needed. 18 g 1 Past Week   Budeson-Glycopyrrol-Formoterol (BREZTRI AEROSPHERE) 160-9-4.8 MCG/ACT AERO Inhale 2 puffs into the lungs in the morning and at bedtime. 10.9 g 3 Past Week   buPROPion (WELLBUTRIN XL) 300 MG 24 hr tablet Take 1 tablet (300 mg total) by mouth daily. (Needs to be seen before next refill) 30 tablet 0 05/28/2021   Multiple Vitamins-Minerals (MULTIVITAMIN WITH MINERALS) tablet Take 1 tablet by mouth daily.   Past Week   omeprazole (PRILOSEC) 20 MG capsule TAKE 1 CAPSULE BY MOUTH EVERY DAY.  Needs to be seen for further refills. 30 capsule 0 05/28/2021   Probiotic Product (PROBIOTIC DAILY PO) Take 1 capsule by mouth daily.   Past Week   venlafaxine XR (EFFEXOR-XR) 75 MG 24 hr capsule Take 75 mg by mouth daily.   05/28/2021   zinc gluconate 50 MG tablet Take 50 mg by mouth daily.   Past Week    Peggyann Juba, PharmD, [facility]: [phone] 05/28/2021,10:16 AM

## 2021-05-28 NOTE — Discharge Instructions (Signed)

## 2021-05-29 ENCOUNTER — Encounter (HOSPITAL_COMMUNITY): Payer: Self-pay | Admitting: General Surgery

## 2021-05-29 LAB — CBC WITH DIFFERENTIAL/PLATELET
Abs Immature Granulocytes: 0.04 10*3/uL (ref 0.00–0.07)
Basophils Absolute: 0 10*3/uL (ref 0.0–0.1)
Basophils Relative: 0 %
Eosinophils Absolute: 0 10*3/uL (ref 0.0–0.5)
Eosinophils Relative: 0 %
HCT: 36.2 % (ref 36.0–46.0)
Hemoglobin: 11.2 g/dL — ABNORMAL LOW (ref 12.0–15.0)
Immature Granulocytes: 1 %
Lymphocytes Relative: 15 %
Lymphs Abs: 1.3 10*3/uL (ref 0.7–4.0)
MCH: 27.7 pg (ref 26.0–34.0)
MCHC: 30.9 g/dL (ref 30.0–36.0)
MCV: 89.4 fL (ref 80.0–100.0)
Monocytes Absolute: 0.6 10*3/uL (ref 0.1–1.0)
Monocytes Relative: 7 %
Neutro Abs: 6.9 10*3/uL (ref 1.7–7.7)
Neutrophils Relative %: 77 %
Platelets: 255 10*3/uL (ref 150–400)
RBC: 4.05 MIL/uL (ref 3.87–5.11)
RDW: 14.8 % (ref 11.5–15.5)
WBC: 8.8 10*3/uL (ref 4.0–10.5)
nRBC: 0 % (ref 0.0–0.2)

## 2021-05-29 LAB — COMPREHENSIVE METABOLIC PANEL
ALT: 57 U/L — ABNORMAL HIGH (ref 0–44)
AST: 37 U/L (ref 15–41)
Albumin: 4.1 g/dL (ref 3.5–5.0)
Alkaline Phosphatase: 47 U/L (ref 38–126)
Anion gap: 5 (ref 5–15)
BUN: 13 mg/dL (ref 6–20)
CO2: 23 mmol/L (ref 22–32)
Calcium: 8.8 mg/dL — ABNORMAL LOW (ref 8.9–10.3)
Chloride: 108 mmol/L (ref 98–111)
Creatinine, Ser: 0.78 mg/dL (ref 0.44–1.00)
GFR, Estimated: >60 mL/min (ref >60)
Glucose, Bld: 125 mg/dL — ABNORMAL HIGH (ref 70–99)
Potassium: 4.1 mmol/L (ref 3.5–5.1)
Sodium: 136 mmol/L (ref 135–145)
Total Bilirubin: 0.2 mg/dL — ABNORMAL LOW (ref 0.3–1.2)
Total Protein: 6.7 g/dL (ref 6.5–8.1)

## 2021-05-29 LAB — SURGICAL PATHOLOGY

## 2021-05-29 MED ORDER — ACETAMINOPHEN 500 MG PO TABS: 1000.0000 mg | ORAL_TABLET | Freq: Three times a day (TID) | ORAL | 0 refills | Status: AC

## 2021-05-29 MED ORDER — VENLAFAXINE HCL 37.5 MG PO TABS: 37.5000 mg | ORAL_TABLET | Freq: Two times a day (BID) | ORAL | 1 refills | Status: AC

## 2021-05-29 MED ORDER — ONDANSETRON 4 MG PO TBDP: 4.0000 mg | ORAL_TABLET | Freq: Four times a day (QID) | ORAL | 0 refills | Status: AC | PRN

## 2021-05-29 MED ORDER — TRAMADOL HCL 50 MG PO TABS: 50.0000 mg | ORAL_TABLET | Freq: Four times a day (QID) | ORAL | 0 refills | Status: AC | PRN

## 2021-05-29 MED ORDER — BUPROPION HCL 100 MG PO TABS: 100.0000 mg | ORAL_TABLET | Freq: Three times a day (TID) | ORAL | 1 refills | Status: DC

## 2021-05-29 NOTE — Progress Notes (Signed)
Patient alert and oriented, pain is controlled. Patient is tolerating fluids, advanced to protein shake today, patient is tolerating well. Reviewed Gastric Bypass discharge instructions with patient and patient is able to articulate understanding. Provided information on BELT program, Support Group and [facility] outpatient pharmacy. All questions answered, will continue to monitor.    

## 2021-05-29 NOTE — Progress Notes (Signed)
24hr fluid recall prior to discharge: 663mL.  Per dehydration protocol, will call pt to f/u within one week post op.

## 2021-05-29 NOTE — Plan of Care (Signed)
°  Problem: Activity: Goal: Ability to tolerate increased activity will improve Outcome: Progressing   Problem: Bowel/Gastric: Goal: Gastrointestinal status for postoperative course will improve Outcome: Progressing Goal: Occurrences of nausea will decrease Outcome: Progressing   Problem: Coping: Goal: Development of coping mechanisms to deal with changes in body function or appearance will improve Outcome: Progressing   Problem: Pain Management: Goal: Pain level will decrease Outcome: Progressing

## 2021-05-29 NOTE — Discharge Summary (Signed)
Physician Discharge Summary  Carmen Stokes DJS:[id number] DOB: December 25, 1975 DOA: 05/28/2021  PCP: [name], DO  Admit date: 05/28/2021 Discharge date: 05/29/2021  Recommendations for Outpatient Follow-up:     Follow-up Information     Greer Pickerel, MD. Go on 06/26/2021.   Specialty: General Surgery Why: at 10:15am.  Please arrive 15 minutes prior to your appointment time. Thank you. Contact information: [address] [place] [address] [phone]         Carlena Hurl, PA-C. Go on 07/19/2021.   Specialty: General Surgery Why: at 9:30am for Dr. Redmond Pulling.  Please arrive 15 minutes prior to your appointment time.  Thank you. Contact information: [address] [place] [address] [phone]                Discharge Diagnoses:  Principal Problem:   S/P gastric bypass Obesity BMI 39  Gastroesophageal reflux disease, unspecified whether esophagitis present  Obstructive sleep apnea  Hypercholesteremia  Enlarged Small bowel mesenteric lymph nodes  Surgical Procedure: Laparoscopic Roux-en-Y gastric bypass with sliding hiatal hernia repair and small bowel mesenteric lymph node biopsy, upper endoscopy  Discharge Condition: Good Disposition: Home  Diet recommendation: Postoperative gastric bypass diet  Filed Weights   05/28/21 0628 05/28/21 1700  Weight: 113.4 kg 113.4 kg     Hospital Course:  The patient was admitted for a planned laparoscopic Roux-en-Y gastric bypass. Found to small sliding hiatal hernia which was repaired. Also found to have scattered LAD in sb mesentery - 1 taken for biopsy. Please see operative note. Preoperatively the patient was given 5000 units of subcutaneous heparin for DVT prophylaxis. ERAS protocol was used. Postoperative prophylactic Lovenox dosing was started on the evening of postoperative day 0.  The patient was started on ice chips and water on the evening of POD 0 which they tolerated. On postoperative day  1 The patient's diet was advanced to protein shakes which they also tolerated. On POD 1, The patient was ambulating without difficulty. Their vital signs are stable without fever or tachycardia. Their hemoglobin had remained stable. The patient had received discharge instructions and counseling. They were deemed stable for discharge.  BP (!) 130/59    Pulse 76    Temp 97.8 F (36.6 C)    Resp 16    Ht 5\' 7"  (1.702 m)    Wt 113.4 kg    LMP 12/31/2019    SpO2 100%    BMI 39.16 kg/m   Gen: alert, NAD, non-toxic appearing Pupils: equal, no scleral icterus Pulm: Lungs clear to auscultation, symmetric chest rise CV: regular rate and rhythm Abd: soft, min tender, nondistended. No cellulitis. No incisional hernia; can see where local was injected on abd Ext: no edema, no calf tenderness Skin: no rash, no jaundice  Discharge Instructions  Discharge Instructions     Ambulate hourly while awake   Complete by: As directed    Call MD for:  difficulty breathing, headache or visual disturbances   Complete by: As directed    Call MD for:  persistant dizziness or light-headedness   Complete by: As directed    Call MD for:  persistant nausea and vomiting   Complete by: As directed    Call MD for:  redness, tenderness, or signs of infection (pain, swelling, redness, odor or green/yellow discharge around incision site)   Complete by: As directed    Call MD for:  severe uncontrolled pain   Complete by: As directed  Call MD for:  temperature >101 F   Complete by: As directed    Diet bariatric full liquid   Complete by: As directed    Discharge instructions   Complete by: As directed    See bariatric discharge instructions   Incentive spirometry   Complete by: As directed    Perform hourly while awake      Allergies as of 05/29/2021   No Known Allergies      Medication List     STOP taking these medications    buPROPion 300 MG 24 hr tablet Commonly known as: WELLBUTRIN XL Replaced by:  buPROPion 100 MG tablet   venlafaxine XR 75 MG 24 hr capsule Commonly known as: EFFEXOR-XR Replaced by: venlafaxine 37.5 MG tablet       TAKE these medications    acetaminophen 500 MG tablet Commonly known as: TYLENOL Take 2 tablets (1,000 mg total) by mouth every 8 (eight) hours for 5 days.   albuterol 108 (90 Base) MCG/ACT inhaler Commonly known as: VENTOLIN HFA 2 puffs every 4-6 hours as needed. What changed: Another medication with the same name was removed. Continue taking this medication, and follow the directions you see here.   Breztri Aerosphere 160-9-4.8 MCG/ACT Aero Generic drug: Budeson-Glycopyrrol-Formoterol Inhale 2 puffs into the lungs in the morning and at bedtime.   buPROPion 100 MG tablet Commonly known as: WELLBUTRIN Take 1 tablet (100 mg total) by mouth 3 (three) times daily. Replaces: buPROPion 300 MG 24 hr tablet   multivitamin with minerals tablet Take 1 tablet by mouth daily.   omeprazole 20 MG capsule Commonly known as: PRILOSEC TAKE 1 CAPSULE BY MOUTH EVERY DAY.  Needs to be seen for further refills.   ondansetron 4 MG disintegrating tablet Commonly known as: ZOFRAN-ODT Take 1 tablet (4 mg total) by mouth every 6 (six) hours as needed for nausea or vomiting.   PROBIOTIC DAILY PO Take 1 capsule by mouth daily.   traMADol 50 MG tablet Commonly known as: ULTRAM Take 1 tablet (50 mg total) by mouth every 6 (six) hours as needed (pain).   venlafaxine 37.5 MG tablet Commonly known as: EFFEXOR Take 1 tablet (37.5 mg total) by mouth 2 (two) times daily with a meal. Replaces: venlafaxine XR 75 MG 24 hr capsule   zinc gluconate 50 MG tablet Take 50 mg by mouth daily.        Follow-up Information     Greer Pickerel, MD. Go on 06/26/2021.   Specialty: General Surgery Why: at 10:15am.  Please arrive 15 minutes prior to your appointment time. Thank you. Contact information: [address] [place] [address] [phone]          Carlena Hurl, PA-C. Go on 07/19/2021.   Specialty: General Surgery Why: at 9:30am for Dr. Redmond Pulling.  Please arrive 15 minutes prior to your appointment time.  Thank you. Contact information: [address] [place] [address] [phone]                  The results of significant diagnostics from this hospitalization (including imaging, microbiology, ancillary and laboratory) are listed below for reference.    Significant Diagnostic Studies: No results found.  Labs: Basic Metabolic Panel: Recent Labs  Lab 05/29/21 0418  NA 136  K 4.1  CL 108  CO2 23  GLUCOSE 125*  BUN 13  CREATININE 0.78  CALCIUM 8.8*   Liver Function Tests: Recent Labs  Lab 05/29/21 0418  AST  37  ALT 57*  ALKPHOS 47  BILITOT 0.2*  PROT 6.7  ALBUMIN 4.1    CBC: Recent Labs  Lab 05/28/21 1031 05/29/21 0418  WBC  --  8.8  NEUTROABS  --  6.9  HGB 11.9* 11.2*  HCT 38.1 36.2  MCV  --  89.4  PLT  --  255    CBG: No results for input(s): GLUCAP in the last 168 hours.  Principal Problem:   S/P gastric bypass   Time coordinating discharge: 25 min  Signed:  Gayland Curry, MD [facility], [place] [phone] 05/29/2021, 12:47 PM

## 2021-05-31 ENCOUNTER — Telehealth (HOSPITAL_COMMUNITY): Payer: Self-pay | Admitting: *Deleted

## 2021-05-31 NOTE — Telephone Encounter (Signed)
1.  Tell me about your pain and pain management? Pt c/o intermittent abdominal pain that has gotten better over time (much improved from yesterday). Pt states that she has been wearing an abdominal binder that had helped with the discomfort.    2.  Let's talk about fluid intake.  How much total fluid are you taking in? Pt states that she is working to meet goal of 64 oz of fluid today.  Pt has been able to consume approx. 40 oz of fluid per day since surgery.  Pt plans to increase clear liquids to meet fluid goals.  Pt instructed to assess status and suggestions daily utilizing Hydration Action Plan on discharge folder and to call [facility] if in the "red zone".   3.  How much protein have you taken in the last 2 days? Pt states that she is working to meet goal of goal of 60g of protein today.  Pt has been able to consumed 45g of protein daily.  Pt plans to drink remainder of protein throughout the rest of the day to meet goal.  4.  Have you had nausea?  Tell me about when have experienced nausea and what you did to help? Pt denies current nausea. Stated that she experienced nausea yesterday, but no vomiting.   5.  Has the frequency or color changed with your urine? Pt states that she is urinating "fine" with no changes in frequency or urgency.     6.  Tell me what your incisions look like? "Incisions look fine". Pt denies a fever, chills.  Pt states incisions are not swollen, open, or draining.  Pt encouraged to call [facility] if incisions change.   7.  Have you been passing gas? BM? Pt states that she has not had a BM.  Pt instructed to take either Miralax or MoM as instructed per "Gastric Bypass/Sleeve Discharge Home Care Instructions".  Pt to call surgeon's office if not able to have BM with medication.   8.  If a problem or question were to arise who would you call?  Do you know contact numbers for [facility], [facility], and [facility]? Pt denies dehydration symptoms.  Pt can describe s/sx of dehydration.  Pt knows to  call [facility] for surgical, [facility] for nutrition, and [facility] for non-urgent questions or concerns.   9.  How has the walking going? Pt states she is walking around and able to be active without difficulty.   10. Are you still using your incentive spirometer?  If so, how often? Pt states that she is using I.S. TID, 10x each time. Pt encouraged to use incentive spirometer, at least 10x every hour while awake until she sees the surgeon.  11.  How are your vitamins and calcium going?  How are you taking them? Pt states that she will begin the supplements tomorrow.  Reinforced education about taking supplements at least two hours apart.  Reminded patient that the first 30 days post-operatively are important for successful recovery.  Practice good hand hygiene, wearing a mask when appropriate (since optional in most places), and minimizing exposure to people who live outside of the home, especially if they are exhibiting any respiratory, GI, or illness-like symptoms.

## 2021-06-11 ENCOUNTER — Other Ambulatory Visit: Payer: Self-pay

## 2021-06-11 ENCOUNTER — Encounter: Payer: BC Managed Care – PPO | Attending: General Surgery | Admitting: Skilled Nursing Facility1

## 2021-06-13 NOTE — Progress Notes (Signed)
2 Week Post-Operative Nutrition Class   Patient was seen on 06/11/2021 for Post-Operative Nutrition education at the [facility].    Surgery date: 05/28/2021 Surgery type: RYGB Start weight at [facility]: 255 Weight today: 243.8 Bowel Habits: Every day to every other day no complaints   Body Composition Scale 06/11/2021  Current Body Weight 243.8  Total Body Fat % 42.4  Visceral Fat 13  Fat-Free Mass % 57.5   Total Body Water % 43.2  Muscle-Mass lbs 34.5  BMI 37  Body Fat Displacement          Torso  lbs 64         Left Leg  lbs 12.8         Right Leg  lbs 12.8         Left Arm  lbs 6.4         Right Arm   lbs 6.4      The following the learning objectives were met by the patient during this course: Identifies Phase 3 (Soft, High Proteins) Dietary Goals and will begin from 2 weeks post-operatively to 2 months post-operatively Identifies appropriate sources of fluids and proteins  Identifies appropriate fat sources and healthy verses unhealthy fat types   States protein recommendations and appropriate sources post-operatively Identifies the need for appropriate texture modifications, mastication, and bite sizes when consuming solids Identifies appropriate fat consumption and sources Identifies appropriate multivitamin and calcium sources post-operatively Describes the need for physical activity post-operatively and will follow MD recommendations States when to call healthcare provider regarding medication questions or post-operative complications   Handouts given during class include: Phase 3A: Soft, High Protein Diet Handout Phase 3 High Protein Meals Healthy Fats   Follow-Up Plan: Patient will follow-up at [facility] in 6 weeks for 2 month post-op nutrition visit for diet advancement per MD.

## 2021-06-17 ENCOUNTER — Telehealth: Payer: Self-pay | Admitting: Skilled Nursing Facility1

## 2021-06-17 NOTE — Telephone Encounter (Signed)
RD called pt to verify fluid intake once starting soft, solid proteins 2 week post-bariatric surgery.   Daily Fluid intake:  Daily Protein intake: Bowel Habits:   Concerns/issues:    LVM 

## 2021-07-24 ENCOUNTER — Ambulatory Visit: Payer: BC Managed Care – PPO | Admitting: Skilled Nursing Facility1

## 2021-07-31 ENCOUNTER — Encounter: Payer: BC Managed Care – PPO | Attending: General Surgery | Admitting: Skilled Nursing Facility1

## 2021-07-31 DIAGNOSIS — Z713 Dietary counseling and surveillance: Secondary | ICD-10-CM | POA: Diagnosis not present

## 2021-07-31 DIAGNOSIS — Z6833 Body mass index (BMI) 33.0-33.9, adult: Secondary | ICD-10-CM | POA: Diagnosis not present

## 2021-07-31 DIAGNOSIS — K219 Gastro-esophageal reflux disease without esophagitis: Secondary | ICD-10-CM | POA: Diagnosis not present

## 2021-07-31 NOTE — Progress Notes (Signed)
Bariatric Nutrition Follow-Up Visit ?Medical Nutrition Therapy  ? ?NUTRITION ASSESSMENT ?  ? ?Surgery date: 05/28/2021 ?Surgery type: RYGB ?Start weight at [facility]: 255 ?Weight today: 221.6 pounds ? ?Body Composition Scale 06/11/2021 07/31/2021  ?Current Body Weight 243.8 221.6  ?Total Body Fat % 42.4 39.7  ?Visceral Fat 13 11  ?Fat-Free Mass % 57.5 60.2  ? Total Body Water % 43.2 44.6  ?Muscle-Mass lbs 34.5 34.2  ?BMI 37 33.6  ?Body Fat Displacement    ?       Torso  lbs 64 54.5  ?       Left Leg  lbs 12.8 10.9  ?       Right Leg  lbs 12.8 10.9  ?       Left Arm  lbs 6.4 5.4  ?       Right Arm   lbs 6.4 5.4  ? ?Clinical  ?Medical hx: anxiety, depression, sleep apnea, hypercholesteremia, GERD ?Medications: Prilosec, Wellbutrin, Effexor, inhaler   ?Labs: Total cholesterol: 258, LDL:  154 ?Notable signs/symptoms: constipation ?Any previous deficiencies? Yes, Iron ?  ?Lifestyle & Dietary Hx ? ?Pt states sometimes if she does not feel like having dinner she will have a protein shake.  Pt states she does not have a huge appetite. ?Pt states chicken has been the toughest to tolerate.  ?Pt states she got her iron labs taken stating she was feeling tired but now looks like it is the respiratory infection.  ?Pt states she usually adds dry seasonings to her veggies.  ? ?Estimated daily fluid intake: 80 oz ?Estimated daily protein intake: 80 g ?Supplements: multi and calcium  ?Current average weekly physical activity: little stepper and 15 minute walking  ? ?24-Hr Dietary Recall ?First Meal: protein shake ?Snack:  cheese stick or jello ?Second Meal: frozen egg bites  ?Snack:  ?Third Meal: eggs and cheese or broiled fish or shrimp or ground beef + cheese ?Snack:  ?Beverages: water ? ?Post-Op Goals/ Signs/ Symptoms ?Using straws: no ?Drinking while eating: no ?Chewing/swallowing difficulties: no ?Changes in vision: no ?Changes to mood/headaches: no ?Hair loss/changes to skin/nails: no ?Difficulty focusing/concentrating:  no ?Sweating: no ?Limb weakness: no ?Dizziness/lightheadedness: no ?Palpitations: no  ?Carbonated/caffeinated beverages: no ?N/V/D/C/Gas: mirilax 1-2 times a week ?Abdominal pain: no ?Dumping syndrome: no ? ?  ?NUTRITION DIAGNOSIS  ?Overweight/obesity ([place]-3.3) related to past poor dietary habits and physical inactivity as evidenced by completed bariatric surgery and following dietary guidelines for continued weight loss and healthy nutrition status. ?  ?  ?NUTRITION INTERVENTION ?Nutrition counseling (C-1) and education (E-2) to facilitate bariatric surgery goals, including: ?Diet advancement to the next phase (phase 4) now including non starchy vegetables ?The importance of consuming adequate calories as well as certain nutrients daily due to the body's need for essential vitamins, minerals, and fats ?The importance of daily physical activity and to reach a goal of at least 150 minutes of moderate to vigorous physical activity weekly (or as directed by their physician) due to benefits such as increased musculature and improved lab values ?The importance of intuitive eating specifically learning hunger-satiety cues and understanding the importance of learning a new body: The importance of mindful eating to avoid grazing behaviors  ? ?Goals: ?-Continue to aim for a minimum of 64 fluid ounces 7 days a week with at least 30 ounces being plain water ? ?-Eat non-starchy vegetables 2 times a day 7 days a week ? ?-Start out with soft cooked vegetables today and tomorrow; if tolerated begin to eat  raw vegetables or cooked including salads ? ?-Eat your 3 ounces of protein first then start in on your non-starchy vegetables; once you understand how much of your meal leads to satisfaction and not full while still eating 3 ounces of protein and non-starchy vegetables you can eat them in any order  ? ?-Continue to aim for 30 minutes of activity at least 5 times a week ? ?-Do NOT cook with/add to your food: alfredo sauce, cheese  sauce, barbeque sauce, ketchup, fat back, butter, bacon grease, grease, Crisco, OR SUGAR ? ?-add in some resistance throughout the week ? ? ?Handouts Provided Include  ?Phase 4 ? ?Learning Style & Readiness for Change ?Teaching method utilized: Visual & Auditory  ?Demonstrated degree of understanding via: Teach Back  ?Readiness Level: action ?Barriers to learning/adherence to lifestyle change: none identified  ? ?RD's Notes for Next Visit ?Assess adherence to pt chosen goals  ? ? ?MONITORING & EVALUATION ?Dietary intake, weekly physical activity, body weight ? ?Next Steps ?Patient is to follow-up mid July  ?

## 2021-10-31 ENCOUNTER — Ambulatory Visit: Payer: BC Managed Care – PPO | Admitting: Skilled Nursing Facility1

## 2021-11-20 ENCOUNTER — Encounter: Payer: BC Managed Care – PPO | Attending: General Surgery | Admitting: Skilled Nursing Facility1

## 2021-11-20 ENCOUNTER — Encounter: Payer: Self-pay | Admitting: Skilled Nursing Facility1

## 2021-11-20 DIAGNOSIS — E669 Obesity, unspecified: Secondary | ICD-10-CM | POA: Insufficient documentation

## 2021-11-20 NOTE — Progress Notes (Signed)
Bariatric Nutrition Follow-Up Visit Medical Nutrition Therapy   NUTRITION ASSESSMENT    Surgery date: 05/28/2021 Surgery type: RYGB Start weight at [facility]: 255 Weight today: 185.8 pounds  Body Composition Scale 06/11/2021 07/31/2021 11/20/2021  Current Body Weight 243.8 221.6 185.8  Total Body Fat % 42.4 39.7 34.2  Visceral Fat '13 11 8  '$ Fat-Free Mass % 57.5 60.2 65.7   Total Body Water % 43.2 44.6 47.3  Muscle-Mass lbs 34.5 34.2 33.7  BMI 37 33.6 28.1  Body Fat Displacement            Torso  lbs 64 54.5 39.3         Left Leg  lbs 12.8 10.9 7.8         Right Leg  lbs 12.8 10.9 7.8         Left Arm  lbs 6.4 5.4 3.9         Right Arm   lbs 6.4 5.4 3.9   Clinical  Medical hx: anxiety, depression, sleep apnea, hypercholesteremia, GERD Medications: Prilosec, Wellbutrin, Effexor, inhaler   Labs: Total cholesterol: 258, LDL:  154 Past Notable signs/symptoms: constipation Any previous deficiencies? Yes, Iron   Lifestyle & Dietary Hx  Pt states she really just is not into animal meats.   Estimated daily fluid intake: 80 oz Estimated daily protein intake: 80 g Supplements: multi and calcium  Current average weekly physical activity: little stepper 15-20 minutes and pool at home once a week  24-Hr Dietary Recall First Meal: protein shake + ice + almond milk or sugar free syrup Snack: peanuts or cheese or cracker chips from ch[employer] Second Meal: egg bites Snack:  Third Meal: bacon and eggs or boiled shrimp Snack:  Beverages: water  Post-Op Goals/ Signs/ Symptoms Using straws: no Drinking while eating: no Chewing/swallowing difficulties: no Changes in vision: no Changes to mood/headaches: no Hair loss/changes to skin/nails: no Difficulty focusing/concentrating: no Sweating: no Limb weakness: no Dizziness/lightheadedness: no Palpitations: no  Carbonated/caffeinated beverages: no N/V/D/C/Gas: no Abdominal pain: no Dumping syndrome: no    NUTRITION DIAGNOSIS   Overweight/obesity ([place]-3.3) related to past poor dietary habits and physical inactivity as evidenced by completed bariatric surgery and following dietary guidelines for continued weight loss and healthy nutrition status.     NUTRITION INTERVENTION Nutrition counseling (C-1) and education (E-2) to facilitate bariatric surgery goals, including:  The importance of consuming adequate calories as well as certain nutrients daily due to the body's need for essential vitamins, minerals, and fats The importance of daily physical activity and to reach a goal of at least 150 minutes of moderate to vigorous physical activity weekly (or as directed by their physician) due to benefits such as increased musculature and improved lab values The importance of intuitive eating specifically learning hunger-satiety cues and understanding the importance of learning a new body: The importance of mindful eating to avoid grazing behaviors  Encouraged patient to honor their body's internal hunger and fullness cues.  Throughout the day, check in mentally and rate hunger. Stop eating when satisfied not full regardless of how much food is left on the plate.  Get more if still hungry 20-30 minutes later.  The key is to honor satisfaction so throughout the meal, rate fullness factor and stop when comfortably satisfied not physically full. The key is to honor hunger and fullness without any feelings of guilt or shame.  Pay attention to what the internal cues are, rather than any external factors. This will enhance the confidence you have  in listening to your own body and following those internal cues enabling you to increase how often you eat when you are hungry not out of appetite and stop when you are satisfied not full.  Encouraged pt to continue to eat balanced meals inclusive of non starchy vegetables 2 times a day 7 days a week Encouraged pt to choose lean protein sources: limiting beef, pork, sausage, hotdogs, and lunch  meat Encourage pt to choose healthy fats such as plant based limiting animal fats Encouraged pt to continue to drink a minium 64 fluid ounces with half being plain water to satisfy proper hydration    Handouts Provided Include  Phase 7  Learning Style & Readiness for Change Teaching method utilized: Visual & Auditory  Demonstrated degree of understanding via: Teach Back  Readiness Level: action Barriers to learning/adherence to lifestyle change: none identified   RD's Notes for Next Visit Assess adherence to pt chosen goals    MONITORING & EVALUATION Dietary intake, weekly physical activity, body weight  Next Steps Patient is to follow-up in 3 months

## 2022-07-14 ENCOUNTER — Other Ambulatory Visit: Payer: Self-pay | Admitting: Student

## 2022-07-14 ENCOUNTER — Other Ambulatory Visit (HOSPITAL_COMMUNITY): Payer: Self-pay | Admitting: General Surgery

## 2022-07-14 DIAGNOSIS — Z9884 Bariatric surgery status: Secondary | ICD-10-CM

## 2022-07-14 DIAGNOSIS — E860 Dehydration: Secondary | ICD-10-CM

## 2022-07-14 NOTE — Progress Notes (Signed)
Orders placed for IV fluids per Dr. Redmond Pulling Also ordered CBC and CMP to be done at same time.  Malachi Pro, [facility] [place]

## 2022-07-15 ENCOUNTER — Ambulatory Visit (INDEPENDENT_AMBULATORY_CARE_PROVIDER_SITE_OTHER): Payer: 59

## 2022-07-15 ENCOUNTER — Other Ambulatory Visit (INDEPENDENT_AMBULATORY_CARE_PROVIDER_SITE_OTHER): Payer: 59

## 2022-07-15 ENCOUNTER — Encounter (HOSPITAL_COMMUNITY): Payer: Self-pay

## 2022-07-15 ENCOUNTER — Ambulatory Visit (HOSPITAL_COMMUNITY): Payer: 59

## 2022-07-15 DIAGNOSIS — E860 Dehydration: Secondary | ICD-10-CM

## 2022-07-15 LAB — COMPREHENSIVE METABOLIC PANEL
ALT: 17 U/L (ref 0–35)
AST: 20 U/L (ref 0–37)
Albumin: 4.5 g/dL (ref 3.5–5.2)
Alkaline Phosphatase: 68 U/L (ref 39–117)
BUN: 11 mg/dL (ref 6–23)
CO2: 28 mEq/L (ref 19–32)
Calcium: 9.5 mg/dL (ref 8.4–10.5)
Chloride: 104 mEq/L (ref 96–112)
Creatinine, Ser: 0.89 mg/dL (ref 0.40–1.20)
GFR: 76.47 mL/min (ref >60.00)
Glucose, Bld: 56 mg/dL — ABNORMAL LOW (ref 70–99)
Potassium: 3.9 mEq/L (ref 3.5–5.1)
Sodium: 139 mEq/L (ref 135–145)
Total Bilirubin: 0.6 mg/dL (ref 0.2–1.2)
Total Protein: 6.9 g/dL (ref 6.0–8.3)

## 2022-07-15 LAB — CBC WITH DIFFERENTIAL/PLATELET
Basophils Absolute: 0 10*3/uL (ref 0.0–0.1)
Basophils Relative: 0.4 % (ref 0.0–3.0)
Eosinophils Absolute: 0.1 10*3/uL (ref 0.0–0.7)
Eosinophils Relative: 2.4 % (ref 0.0–5.0)
HCT: 39.4 % (ref 36.0–46.0)
Hemoglobin: 13.1 g/dL (ref 12.0–15.0)
Lymphocytes Relative: 31 % (ref 12.0–46.0)
Lymphs Abs: 1.6 10*3/uL (ref 0.7–4.0)
MCHC: 33.4 g/dL (ref 30.0–36.0)
MCV: 91.3 fl (ref 78.0–100.0)
Monocytes Absolute: 0.4 10*3/uL (ref 0.1–1.0)
Monocytes Relative: 8.3 % (ref 3.0–12.0)
Neutro Abs: 3.1 10*3/uL (ref 1.4–7.7)
Neutrophils Relative %: 57.9 % (ref 43.0–77.0)
Platelets: 224 10*3/uL (ref 150.0–400.0)
RBC: 4.31 Mil/uL (ref 3.87–5.11)
RDW: 12.7 % (ref 11.5–15.5)
WBC: 5.3 10*3/uL (ref 4.0–10.5)

## 2022-07-15 MED ORDER — ONDANSETRON HCL 4 MG/2ML IJ SOLN: 4.0000 mg | INTRAMUSCULAR | Status: DC | PRN

## 2022-07-15 MED ORDER — SODIUM CHLORIDE 0.9 % IV BOLUS
1000.0000 mL | Freq: Once | INTRAVENOUS | Status: AC
  Administered 2022-07-15: 1000 mL via INTRAVENOUS
  Filled 2022-07-15: qty 1000

## 2022-07-15 MED ORDER — THIAMINE HCL 100 MG/ML IJ SOLN
Freq: Once | INTRAVENOUS | Status: AC
  Filled 2022-07-15: qty 1000

## 2022-07-15 MED ORDER — ONDANSETRON 4 MG PO TBDP: 4.0000 mg | ORAL_TABLET | ORAL | Status: DC | PRN

## 2022-07-15 NOTE — Progress Notes (Signed)
Diagnosis: Dehydration  Provider:  Marshell Garfinkel MD  Procedure: Infusion  IV Type: Peripheral, IV Location: L Antecubital  Banana Bag, Dose: 1000 ml  Infusion Start Time: 1031  Infusion Stop Time: 1135  Sodium chloride 0.9% 1000 ml  Infusion start time  1134  Infusion stop time 12:41  Post Infusion IV Care: Peripheral IV Discontinued  Discharge: Condition: Good, Destination: Home . AVS Declined  Performed by:  Fraser Din [place], RN

## 2022-07-15 NOTE — Addendum Note (Signed)
Addended by: Doyne Keel on: 07/15/2022 08:47 AM   Modules accepted: Orders

## 2022-07-18 ENCOUNTER — Other Ambulatory Visit: Payer: 59

## 2022-07-21 ENCOUNTER — Ambulatory Visit (HOSPITAL_COMMUNITY): Payer: 59

## 2022-07-25 ENCOUNTER — Ambulatory Visit (HOSPITAL_COMMUNITY)
Admission: RE | Admit: 2022-07-25 | Discharge: 2022-07-25 | Disposition: A | Discharge status: Home / Self Care | Payer: 59 | Source: Ambulatory Visit | Attending: General Surgery | Admitting: General Surgery

## 2022-07-25 DIAGNOSIS — Z9884 Bariatric surgery status: Secondary | ICD-10-CM | POA: Diagnosis present

## 2022-07-25 MED ORDER — IOHEXOL 9 MG/ML PO SOLN
ORAL | Status: AC
  Filled 2022-07-25: qty 1000

## 2022-07-25 MED ORDER — IOHEXOL 300 MG/ML  SOLN
100.0000 mL | Freq: Once | INTRAMUSCULAR | Status: AC | PRN
  Administered 2022-07-25: 100 mL via INTRAVENOUS

## 2022-07-31 ENCOUNTER — Encounter: Payer: Self-pay | Admitting: Nurse Practitioner

## 2022-08-29 IMAGING — RF DG UGI W SINGLE CM
9 of 12 series · 12 of 24 positions shown · non-contrast
Comparison: None.

CLINICAL DATA: Preoperative study for bariatric surgery. History of
GERD.

EXAM:
UPPER GI SERIES WITH KUB
TECHNIQUE: After obtaining a scout radiograph a routine upper GI series was
performed using thin barium.
FLUOROSCOPY TIME:  Fluoroscopy Time:  1 minute, 36 seconds
Radiation Exposure Index (if provided by the fluoroscopic device):
14.9 mGy
Number of Acquired Spot Images: 0

[Series 2: cp_standard · 0.52mm/px · 2 of 28 frames shown (1 of 9)]
[frame 1/28]
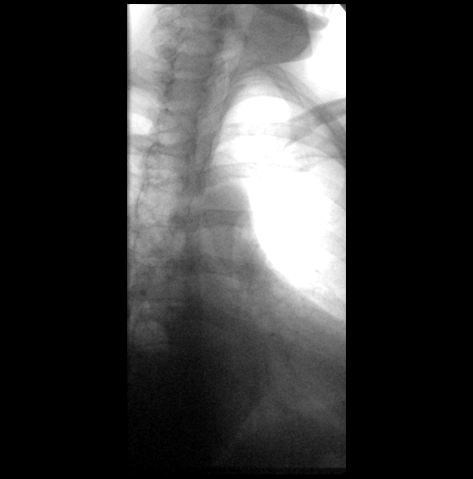
[frame 24/28]
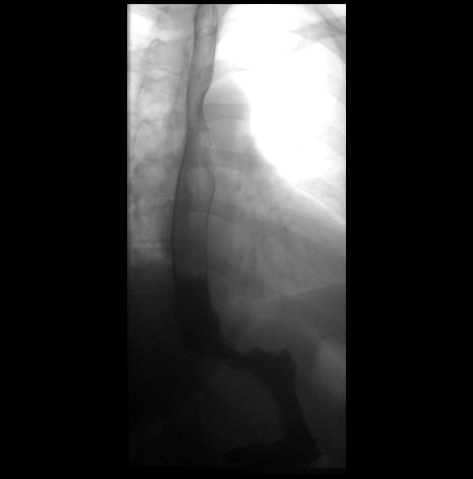

[Series 3: cp_standard · 0.52mm/px · 1 of 28 frames shown (2 of 9)]
[frame 24/28]
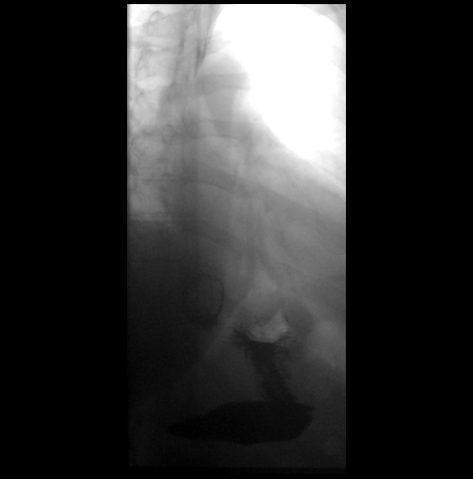

[Series 4: cp_standard · 0.35mm/px · 2 of 32 frames shown (3 of 9)]
[frame 5/32]
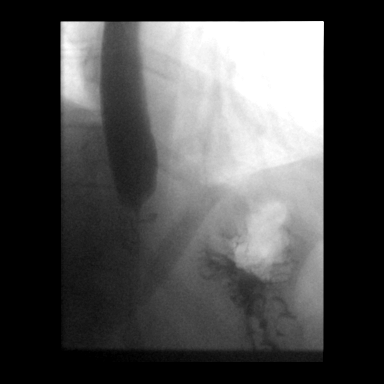
[frame 32/32]
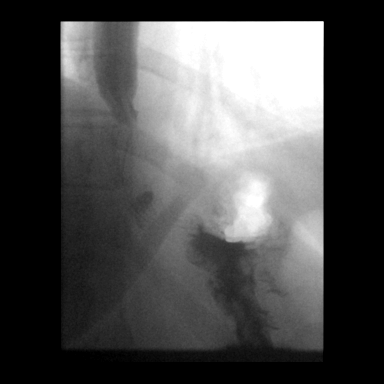

[Series 5: cp_standard · 0.36mm/px · 1 of 17 frames shown (4 of 9)]
[frame 15/17]
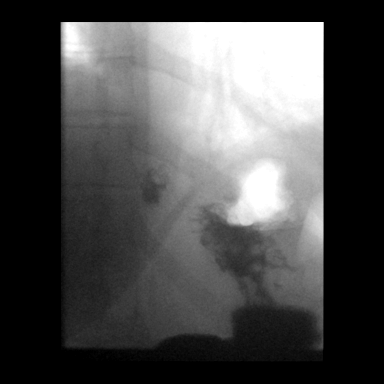

[Series 8: cp_standard · 0.17mm/px · 1 of 1 slices shown (5 of 9)]
[im 1/1]
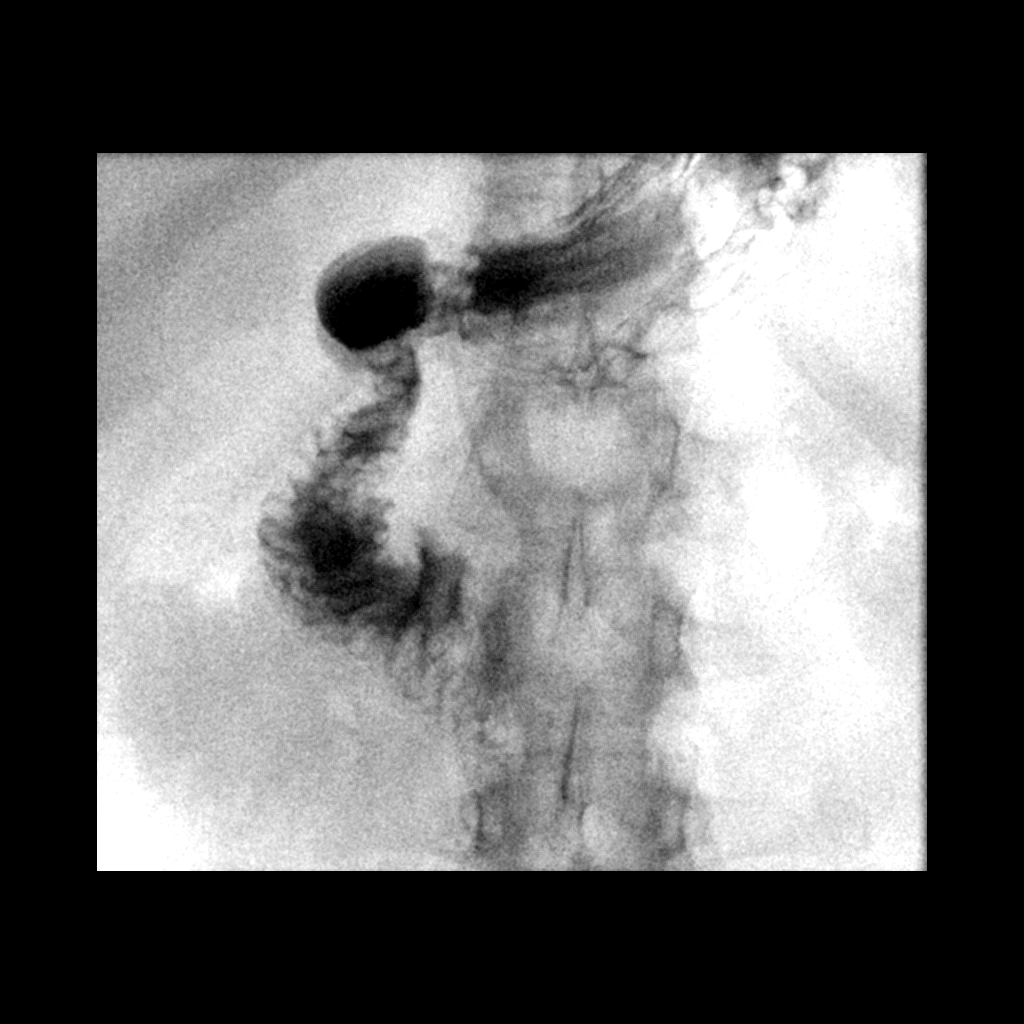

[Series 9: cp_standard · 0.54mm/px · 1 of 54 frames shown (6 of 9)]
[frame 28/54]
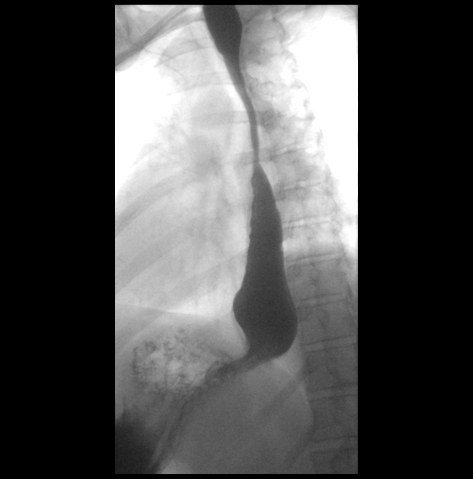

[Series 10: cp_standard · 0.54mm/px · 2 of 47 frames shown (7 of 9)]
[frame 8/47]
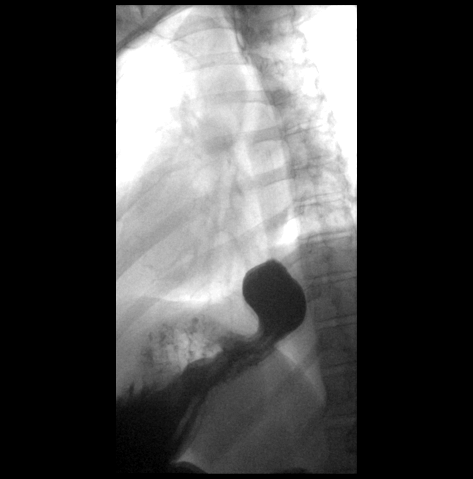
[frame 40/47]
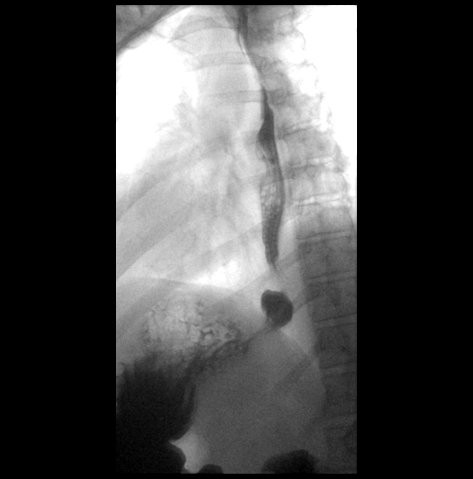

[Series 12: cp_standard · 0.51mm/px · 1 of 3 frames shown (8 of 9)]
[frame 1/3]
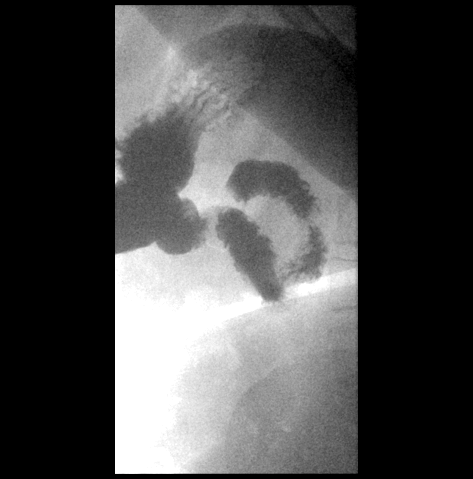

[Series 13: cp_standard · 0.25mm/px · 1 of 1 slices shown (9 of 9)]
[im 1/1]
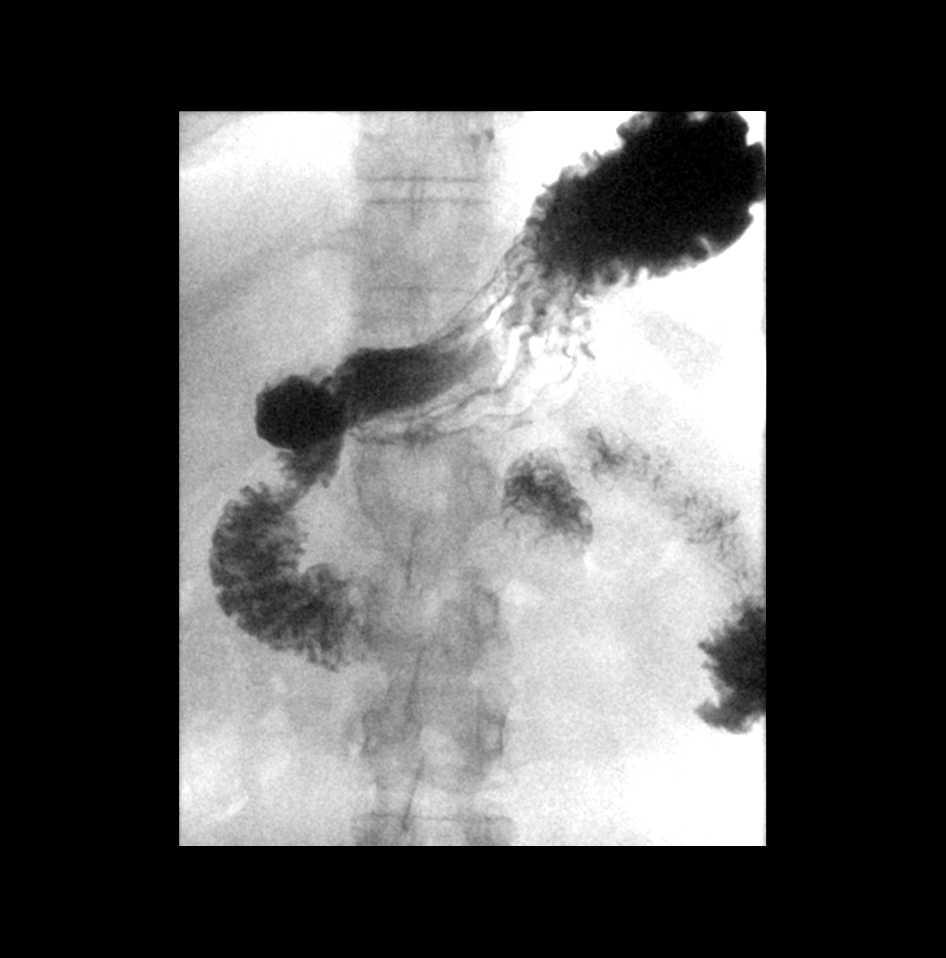

[12 of 24 positions shown; findings below may reference images not displayed]

FINDINGS: Preliminary KUB demonstrates a nonobstructive bowel gas pattern.

Primary peristaltic waves in the esophagus were normal. No
esophageal stricture, ulceration, or other significant abnormality.
Moderate gastroesophageal reflux to the midesophagus occurred
spontaneously during the course of the examination. Tiny sliding
hiatal hernia.

Gastric morphology and appearance appear normal. Proximal duodenum
appears normal.
IMPRESSION: 1. Tiny hiatal hernia.
2. Moderate gastroesophageal reflux.

## 2022-09-25 ENCOUNTER — Ambulatory Visit: Payer: 59 | Admitting: Nurse Practitioner

## 2022-09-25 ENCOUNTER — Encounter: Payer: Self-pay | Admitting: Nurse Practitioner

## 2022-09-25 ENCOUNTER — Encounter (HOSPITAL_COMMUNITY): Payer: Self-pay | Admitting: Student

## 2022-09-25 VITALS — BP 114/82 | HR 83 | Ht 67.0 in | Wt 179.5 lb

## 2022-09-25 DIAGNOSIS — R194 Change in bowel habit: Secondary | ICD-10-CM

## 2022-09-25 DIAGNOSIS — R1084 Generalized abdominal pain: Secondary | ICD-10-CM

## 2022-09-25 DIAGNOSIS — Z1211 Encounter for screening for malignant neoplasm of colon: Secondary | ICD-10-CM | POA: Diagnosis not present

## 2022-09-25 DIAGNOSIS — R140 Abdominal distension (gaseous): Secondary | ICD-10-CM

## 2022-09-25 MED ORDER — NA SULFATE-K SULFATE-MG SULF 17.5-3.13-1.6 GM/177ML PO SOLN: 1.0000 | ORAL | 0 refills | Status: DC

## 2022-09-25 NOTE — Progress Notes (Signed)
Agree with assessment / plan as outlined.  

## 2022-09-25 NOTE — Progress Notes (Signed)
Assessment / Plan   Primary GI: New - Carmen Patrick, MD  47 yo female who is s/p post laparoscopic Roux-en-Y gastric bypass and small bowel mesenteric lymph node excisional biopsy in February 2023 by Carmen Stokes. Andrey Stokes. Over the last 6 months she has developed postprandial generalized upper abdominal pain, abdominal bloating and occasional nausea.  She is being followed by Surgery who has referred her here for further evaluation . No acute findings on CT scan. HIDA is pending Plan:  -Schedule for EGD. The risks and benefits of EGD with possible biopsies were discussed with the patient who agrees to proceed.  -Patient will notify us if her HIDA scan is abnormal in which case we may opt to hold off on upper endoscopy  Colon cancer screening.  Patient has not previously undergone colon cancer screening.  Her grandfather had colon cancer but it was in his 80s Plan:  -Schedule for a screening colonoscopy. The risks and benefits of colonoscopy with possible polypectomy / biopsies were discussed and the patient agrees to proceed.   Bowel changes over the last several months.  Stools loose and yellowish.  Plan:  Further evaluation at time of screening colonoscopy. The risks and benefits of colonoscopy with possible polypectomy / biopsies were discussed and the patient agrees to proceed.    History of Present Illness   Chief Complaint: multiple GI compaints including upper abdominal pain, occasional nausea, upper abdominal bloating, bowel changes.   47 y.o. yo female with a past medical history consisting of, but not necessarily limited to obesity status post Roux-en-Y gastric bypass Feb 2023, asthma, depression, sleep apnea.  Patient is referred by Carmen Stokes for evaluation of abdominal pain.   At time of gastric bypass in Feb 2023 an intraoperative EGD was done to evaluate foregut anatomy.    Description of Procedure: During the procedure, I scrubbed out and obtained the Olympus endoscope. I gently  placed endoscope in the patient's oropharynx and gently glided it down the esophagus without any difficulty under direct visualization.  The scope was advanced as far as the roux limb and then slowly withdrawn to inspect the foregut anatomy.  Carmen Stokes. Andrey Stokes had placed saline in the upper abdomen and all staple lines were submerged to ensure no air leak. There was no evidence of bubbles. There was no evidence of intraluminal bleeding and the mucosa appeared healthy.  The lumen was widely patent without evidence of stricture.  The intraluminal insufflation was decompressed. The scope was withdrawn. The patient tolerated this portion of the procedure well. Please see Carmen Stokes Carmen Stokes operative note for details regarding the remainder of the procedure.     At the time of her gastric bypass a small bowel mesenteric lymph node excisional biopsy was done.  Path showed lymph node tissue with reactive lymphoid hyperplasia, benign adipose tissue.    Carmen Stokes did okay post-operatively but then began having postprandial generalized upper abdominal pain and bloating over the last 6 months.  Sometimes the pain radiates through to her back but this is not consistent. Some occasional nausea.  No NSAID use.   Carmen Stokes also describes some bowel changes over the last several months. Stools are yellowish in color and on the loose side. No blood in stool. Her GF was diagnosed with colon cancer in his 37's.   She returned to her surgeon for evaluation of above symptoms. CT scan with contrast on 07/26/22 was without acute findings. She is supposed to be getting a HIDA scan. Surgery gave her protonix  and carafate which helped but only for a while.   Previous Endoscopies / Labs /  Imaging      Latest Ref Rng & Units 07/15/2022   10:32 AM 05/29/2021    4:18 AM 05/28/2021   10:31 AM  CBC  WBC 4.0 - 10.5 K/uL 5.3  8.8    Hemoglobin 12.0 - 15.0 g/dL 69.6  78.9  38.1   Hematocrit 36.0 - 46.0 % 39.4  36.2  38.1   Platelets 150.0 - 400.0 K/uL  224.0  255      No results found for: "LIPASE"    Latest Ref Rng & Units 07/15/2022   10:32 AM 05/29/2021    4:18 AM 05/14/2021    9:25 AM  CMP  Glucose 70 - 99 mg/dL 56  017  92   BUN 6 - 23 mg/dL 11  13  25    Creatinine 0.40 - 1.20 mg/dL 5.10  2.58  5.27   Sodium 135 - 145 mEq/L 139  136  136   Potassium 3.5 - 5.1 mEq/L 3.9  4.1  4.3   Chloride 96 - 112 mEq/L 104  108  102   CO2 19 - 32 mEq/L 28  23  26    Calcium 8.4 - 10.5 mg/dL 9.5  8.8  9.4   Total Protein 6.0 - 8.3 g/dL 6.9  6.7  7.9   Total Bilirubin 0.2 - 1.2 mg/dL 0.6  0.2  0.7   Alkaline Phos 39 - 117 U/L 68  47  76   AST 0 - 37 U/L 20  37  26   ALT 0 - 35 U/L 17  57  31      CT ABDOMEN PELVIS W CONTRAST CLINICAL DATA:  Bloating  EXAM: CT ABDOMEN AND PELVIS WITH CONTRAST  TECHNIQUE: Multidetector CT imaging of the abdomen and pelvis was performed using the standard protocol following bolus administration of intravenous contrast.  RADIATION DOSE REDUCTION: This exam was performed according to the departmental dose-optimization program which includes automated exposure control, adjustment of the mA and/or kV according to patient size and/or use of iterative reconstruction technique.  CONTRAST:  OMNIPAQUE IOHEXOL 300 MG/ML  SOLN and oral contrast  COMPARISON:  None Available.  FINDINGS: Lower chest: Minimal dependent subsegmental atelectasis. No pleural or pericardial effusion  Hepatobiliary: No focal liver abnormality is seen. No gallstones, gallbladder wall thickening, or biliary dilatation.  Pancreas: Unremarkable. No pancreatic ductal dilatation or surrounding inflammatory changes.  Spleen: Normal in size without focal abnormality.  Adrenals/Urinary Tract: Adrenal glands are unremarkable. Kidneys are normal, without renal calculi, focal lesion, or hydronephrosis. Bladder is unremarkable.  Stomach/Bowel: Postop changes gastric bypass. No bowel dilatation to suggest obstruction. No mucosal  abnormalities identified. Unremarkable appendix.  Vascular/Lymphatic: No significant vascular findings are present. No enlarged abdominal or pelvic lymph nodes.  Reproductive: Status post hysterectomy. No adnexal masses.  Other: Small anterior abdominal wall hernia noted containing omental fat without evidence of herniated bowel or incarceration. No abdominopelvic ascites.  Musculoskeletal: Lumbosacral degenerative changes. No acute osseous abnormalities identified.  IMPRESSION: No acute pathology identified in the abdomen and pelvis.  Electronically Signed   By: Layla Maw M.D.   On: 07/25/2022 17:26    Past Medical History:  Diagnosis Date   Allergy    Anxiety    Asthma    Depression    GERD (gastroesophageal reflux disease)    PONV (postoperative nausea and vomiting)    Sleep apnea    Past Surgical History:  Procedure Laterality Date   DILITATION & CURRETTAGE/HYSTROSCOPY WITH NOVASURE ABLATION N/A 12/02/2018   Procedure: DILATATION & CURETTAGE/HYSTEROSCOPY WITH NOVASURE ABLATION;  Surgeon: Mitchel Honour, DO;  Location: [facility];  Service: Gynecology;  Laterality: N/A;   ESOPHAGOGASTRODUODENOSCOPY     age 1 or 42 due to stomach issues   GASTRIC ROUX-EN-Y N/A 05/28/2021   Procedure: LAPAROSCOPIC ROUX-EN-Y GASTRIC BYPASS WITH UPPER ENDOSCOPY, SMALL BOWEL MESSENTERIC LYMPH NODE BIOPSY;  Surgeon: Gaynelle Adu, MD;  Location: [facility];  Service: General;  Laterality: N/A;   HIATAL HERNIA REPAIR N/A 05/28/2021   Procedure: HERNIA REPAIR HIATAL;  Surgeon: Gaynelle Adu, MD;  Location: [facility];  Service: General;  Laterality: N/A;   LAPAROSCOPIC TUBAL LIGATION Bilateral 12/02/2018   Procedure: LAPAROSCOPIC TUBAL LIGATION;  Surgeon: Mitchel Honour, DO;  Location: [facility];  Service: Gynecology;  Laterality: Bilateral;   LAPAROSCOPIC VAGINAL HYSTERECTOMY WITH SALPINGECTOMY Bilateral 01/16/2020   Procedure: LAPAROSCOPIC ASSISTED VAGINAL  HYSTERECTOMY WITH SALPINGECTOMY;  Surgeon: Mitchel Honour, DO;  Location: [facility];  Service: Gynecology;  Laterality: Bilateral;   SEPTOPLASTY  2020   SINOSCOPY     WISDOM TOOTH EXTRACTION     Family History  Problem Relation Age of Onset   Arthritis Mother    Cancer Mother    Thyroid disease Mother    Depression Father    Hyperlipidemia Brother    Hypertension Brother    Colon cancer Maternal Grandfather    Thyroid disease Maternal Aunt    Social History   Tobacco Use   Smoking status: Former    Types: Cigarettes    Quit date: 04/22/2011    Years since quitting: 11.4   Smokeless tobacco: Never  Vaping Use   Vaping Use: Never used  Substance Use Topics   Alcohol use: No   Drug use: No   Current Outpatient Medications  Medication Sig Dispense Refill   albuterol (VENTOLIN HFA) 108 (90 Base) MCG/ACT inhaler 2 puffs every 4-6 hours as needed. 18 g 1   Budeson-Glycopyrrol-Formoterol (BREZTRI AEROSPHERE) 160-9-4.8 MCG/ACT AERO Inhale 2 puffs into the lungs in the morning and at bedtime. 10.9 g 3   estradiol (CLIMARA - DOSED IN MG/24 HR) 0.05 mg/24hr patch Place 0.05 mg onto the skin once a week.     Multiple Vitamins-Minerals (MULTIVITAMIN WITH MINERALS) tablet Take 1 tablet by mouth daily.     ondansetron (ZOFRAN-ODT) 4 MG disintegrating tablet Take 1 tablet (4 mg total) by mouth every 6 (six) hours as needed for nausea or vomiting. 20 tablet 0   pantoprazole (PROTONIX) 40 MG tablet Take 40 mg by mouth 2 (two) times daily.     Probiotic Product (PROBIOTIC DAILY PO) Take 1 capsule by mouth daily.     sucralfate (CARAFATE) 1 g tablet Take 1 g by mouth.     traMADol (ULTRAM) 50 MG tablet Take 1 tablet (50 mg total) by mouth every 6 (six) hours as needed (pain). 10 tablet 0   zinc gluconate 50 MG tablet Take 50 mg by mouth daily.     buPROPion (WELLBUTRIN) 100 MG tablet Take 1 tablet (100 mg total) by mouth 3 (three) times daily. 90 tablet 1   venlafaxine (EFFEXOR)  37.5 MG tablet Take 1 tablet (37.5 mg total) by mouth 2 (two) times daily with a meal. 60 tablet 1   No current facility-administered medications for this visit.   No Known Allergies   Review of Systems: Positive for anxiety, back pain, depression, fatigue, headaches, hearing  problems.  All other systems reviewed and negative except where noted in HPI.   Wt Readings from Last 3 Encounters:  09/25/22 179 lb 8 oz (81.4 kg)  07/15/22 176 lb 6.4 oz (80 kg)  11/20/21 185 lb 12.8 oz (84.3 kg)    Physical Exam:  BP 114/82   Pulse 83   Ht 5\' 7"  (1.702 m)   Wt 179 lb 8 oz (81.4 kg)   LMP 12/31/2019   BMI 28.11 kg/m  Constitutional:  Pleasant, generally well appearing female in no acute distress. Psychiatric:  Normal mood and affect. Behavior is normal. EENT: Pupils normal.  Conjunctivae are normal. No scleral icterus. Neck supple.  Cardiovascular: Normal rate, regular rhythm.  Pulmonary/chest: Effort normal and breath sounds normal. No wheezing, rales or rhonchi. Abdominal: Soft, nondistended, nontender. Bowel sounds active throughout. There are no masses palpable. No hepatomegaly. Neurological: Alert and oriented to person place and time. Skin: Skin is warm and dry. No rashes noted.  Carmen Cluster, NP  09/25/2022, 11:48 AM  Cc:  Referring Provider Gaynelle Adu, MD

## 2022-09-25 NOTE — Patient Instructions (Signed)
_______________________________________________________  If your blood pressure at your visit was 140/90 or greater, please contact your primary care physician to follow up on this.  If you are age 49 or younger, your body mass index should be between 19-25. Your Body mass index is 28.11 kg/m. If this is out of the aformentioned range listed, please consider follow up with your Primary Care Provider.  ________________________________________________________  The [facility] providers would like to encourage you to use [facility] to communicate with providers for non-urgent requests or questions.  Due to long hold times on the telephone, sending your provider a message by [facility] may be a faster and more efficient way to get a response.  Please allow 48 business hours for a response.  Please remember that this is for non-urgent requests.  _______________________________________________________  Carmen Stokes have been scheduled for an endoscopy and colonoscopy. Please follow the written instructions given to you at your visit today. Please pick up your prep supplies at the pharmacy within the next 1-3 days. If you use inhalers (even only as needed), please bring them with you on the day of your procedure.  Due to recent changes in healthcare laws, you may see the results of your imaging and laboratory studies on MyChart before your provider has had a chance to review them.  We understand that in some cases there may be results that are confusing or concerning to you. Not all laboratory results come back in the same time frame and the provider may be waiting for multiple results in order to interpret others.  Please give [place] 48 hours in order for your provider to thoroughly review all the results before contacting the office for clarification of your results.   Thank you for entrusting me with your care and choosing [facility].  Willette Cluster, NP

## 2022-09-29 ENCOUNTER — Other Ambulatory Visit (HOSPITAL_COMMUNITY): Payer: Self-pay | Admitting: Student

## 2022-09-29 DIAGNOSIS — R1084 Generalized abdominal pain: Secondary | ICD-10-CM

## 2022-10-14 ENCOUNTER — Inpatient Hospital Stay (HOSPITAL_COMMUNITY): Admission: RE | Admit: 2022-10-14 | Payer: 59 | Source: Ambulatory Visit

## 2022-10-15 ENCOUNTER — Encounter (HOSPITAL_COMMUNITY)
Admission: RE | Admit: 2022-10-15 | Discharge: 2022-10-15 | Disposition: A | Discharge status: Home / Self Care | Payer: 59 | Source: Ambulatory Visit | Attending: Student | Admitting: Student

## 2022-10-15 ENCOUNTER — Encounter (HOSPITAL_COMMUNITY): Payer: Self-pay

## 2022-10-15 DIAGNOSIS — R1084 Generalized abdominal pain: Secondary | ICD-10-CM | POA: Diagnosis present

## 2022-10-15 MED ORDER — STERILE WATER FOR INJECTION IJ SOLN
INTRAMUSCULAR | Status: AC
  Filled 2022-10-15: qty 10

## 2022-10-15 MED ORDER — SINCALIDE 5 MCG IJ SOLR
INTRAMUSCULAR | Status: AC
  Filled 2022-10-15: qty 5

## 2022-10-15 MED ORDER — TECHNETIUM TC 99M MEBROFENIN IV KIT
5.0000 | PACK | Freq: Once | INTRAVENOUS | Status: AC | PRN
  Administered 2022-10-15: 5.1 via INTRAVENOUS

## 2022-10-20 ENCOUNTER — Encounter: Payer: Self-pay | Admitting: Gastroenterology

## 2022-10-20 ENCOUNTER — Ambulatory Visit (AMBULATORY_SURGERY_CENTER): Payer: 59 | Admitting: Gastroenterology

## 2022-10-20 VITALS — BP 119/69 | HR 77 | Temp 96.9°F | Resp 16 | Ht 67.0 in | Wt 179.0 lb

## 2022-10-20 DIAGNOSIS — D123 Benign neoplasm of transverse colon: Secondary | ICD-10-CM | POA: Diagnosis not present

## 2022-10-20 DIAGNOSIS — R109 Unspecified abdominal pain: Secondary | ICD-10-CM

## 2022-10-20 DIAGNOSIS — Z800 Family history of malignant neoplasm of digestive organs: Secondary | ICD-10-CM | POA: Diagnosis not present

## 2022-10-20 DIAGNOSIS — Z1211 Encounter for screening for malignant neoplasm of colon: Secondary | ICD-10-CM

## 2022-10-20 DIAGNOSIS — T182XXA Foreign body in stomach, initial encounter: Secondary | ICD-10-CM

## 2022-10-20 DIAGNOSIS — Z9884 Bariatric surgery status: Secondary | ICD-10-CM

## 2022-10-20 MED ORDER — SODIUM CHLORIDE 0.9 % IV SOLN: 500.0000 mL | Freq: Once | INTRAVENOUS | Status: DC

## 2022-10-20 NOTE — Patient Instructions (Signed)
Handouts on polyps & hemorrhoids given to you today.   Await pathology results on polyps removed and on colon biopsies done  Await gastric biopsies results      YOU HAD AN ENDOSCOPIC PROCEDURE TODAY AT THE [facility]:   Refer to the procedure report that was given to you for any specific questions about what was found during the examination.  If the procedure report does not answer your questions, please call your gastroenterologist to clarify.  If you requested that your care partner not be given the details of your procedure findings, then the procedure report has been included in a sealed envelope for you to review at your convenience later.  YOU SHOULD EXPECT: Some feelings of bloating in the abdomen. Passage of more gas than usual.  Walking can help get rid of the air that was put into your GI tract during the procedure and reduce the bloating. If you had a lower endoscopy (such as a colonoscopy or flexible sigmoidoscopy) you may notice spotting of blood in your stool or on the toilet paper. If you underwent a bowel prep for your procedure, you may not have a normal bowel movement for a few days.  Please Note:  You might notice some irritation and congestion in your nose or some drainage.  This is from the oxygen used during your procedure.  There is no need for concern and it should clear up in a day or so.  SYMPTOMS TO REPORT IMMEDIATELY:  Following lower endoscopy (colonoscopy or flexible sigmoidoscopy):  Excessive amounts of blood in the stool  Significant tenderness or worsening of abdominal pains  Swelling of the abdomen that is new, acute  Fever of 100F or higher  Following upper endoscopy (EGD)  Vomiting of blood or coffee ground material  New chest pain or pain under the shoulder blades  Painful or persistently difficult swallowing  New shortness of breath  Fever of 100F or higher  Black, tarry-looking stools  For urgent or emergent issues, a  gastroenterologist can be reached at any hour by calling [phone]. Do not use MyChart messaging for urgent concerns.    DIET:  We do recommend a small meal at first, but then you may proceed to your regular diet.  Drink plenty of fluids but you should avoid alcoholic beverages for 24 hours.  ACTIVITY:  You should plan to take it easy for the rest of today and you should NOT DRIVE or use heavy machinery until tomorrow (because of the sedation medicines used during the test).    FOLLOW UP: Our staff will call the number listed on your records the next business day following your procedure.  We will call around 7:15- 8:00 am to check on you and address any questions or concerns that you may have regarding the information given to you following your procedure. If we do not reach you, we will leave a message.     If any biopsies were taken you will be contacted by phone or by letter within the next 1-3 weeks.  Please call us at [phone] if you have not heard about the biopsies in 3 weeks.    SIGNATURES/CONFIDENTIALITY: You and/or your care partner have signed paperwork which will be entered into your electronic medical record.  These signatures attest to the fact that that the information above on your After Visit Summary has been reviewed and is understood.  Full responsibility of the confidentiality of this discharge information lies with you and/or  your care-partner. 

## 2022-10-20 NOTE — Progress Notes (Signed)
Called to room to assist during endoscopic procedure.  Patient ID and intended procedure confirmed with present staff. Received instructions for my participation in the procedure from the performing physician.  

## 2022-10-20 NOTE — Progress Notes (Signed)
Pt resting comfortably. VSS. Airway intact. SBAR complete to RN. All questions answered.   

## 2022-10-20 NOTE — Op Note (Signed)
[facility] Patient Name: Carmen Stokes Procedure Date: 10/20/2022 2:37 PM MRN: [id number] Endoscopist: Viviann Spare P. Adela Lank , MD, [id number] Age: 47 Referring MD:  Date of Birth: 1975/04/30 Gender: Female Account #: [id number] Procedure:                Colonoscopy Indications:              Screening for colon cancer: Family history of                            colorectal cancer in multiple 2nd degree relatives                            (grandfather, aunt, uncle) - first time exam,                            incidental - intermittent loose stools which have                            persisted Medicines:                Monitored Anesthesia Care Procedure:                Pre-Anesthesia Assessment:                           - Prior to the procedure, a History and Physical                            was performed, and patient medications and                            allergies were reviewed. The patient's tolerance of                            previous anesthesia was also reviewed. The risks                            and benefits of the procedure and the sedation                            options and risks were discussed with the patient.                            All questions were answered, and informed consent                            was obtained. Prior Anticoagulants: The patient has                            taken no anticoagulant or antiplatelet agents. ASA                            Grade Assessment: II - A patient with mild systemic  disease. After reviewing the risks and benefits,                            the patient was deemed in satisfactory condition to                            undergo the procedure.                           After obtaining informed consent, the colonoscope                            was passed under direct vision. Throughout the                            procedure, the patient's blood pressure, pulse, and                             oxygen saturations were monitored continuously. The                            Olympus PCF-H190DL (#[id number]) Colonoscope was                            introduced through the anus and advanced to the the                            terminal ileum, with identification of the                            appendiceal orifice and IC valve. The colonoscopy                            was performed without difficulty. The patient                            tolerated the procedure well. The quality of the                            bowel preparation was good. The terminal ileum,                            ileocecal valve, appendiceal orifice, and rectum                            were photographed. Scope In: 3:17:00 PM Scope Out: 3:34:00 PM Scope Withdrawal Time: 0 hours 13 minutes 30 seconds  Total Procedure Duration: 0 hours 17 minutes 0 seconds  Findings:                 The perianal and digital rectal examinations were                            normal.  The terminal ileum appeared normal.                           A diminutive polyp was found in the transverse                            colon. The polyp was flat. The polyp was removed                            with a cold biopsy forceps. Resection and retrieval                            were complete.                           Internal hemorrhoids were found during                            retroflexion. The hemorrhoids were small.                           The exam was otherwise without abnormality.                           Biopsies for histology were taken with a cold                            forceps from the right colon, left colon and                            transverse colon for evaluation of microscopic                            colitis. Complications:            No immediate complications. Estimated blood loss:                            Minimal. Estimated Blood Loss:      Estimated blood loss was minimal. Impression:               - The examined portion of the ileum was normal.                           - One diminutive polyp in the transverse colon,                            removed with a cold biopsy forceps. Resected and                            retrieved.                           - Internal hemorrhoids.                           -  The examination was otherwise normal.                           - Biopsies were taken with a cold forceps from the                            right colon, left colon and transverse colon for                            evaluation of microscopic colitis. Recommendation:           - Patient has a contact number available for                            emergencies. The signs and symptoms of potential                            delayed complications were discussed with the                            patient. Return to normal activities tomorrow.                            Written discharge instructions were provided to the                            patient.                           - Resume previous diet.                           - Continue present medications.                           - Await pathology results. Viviann Spare P. Brainard Highfill, MD 10/20/2022 3:39:18 PM This report has been signed electronically.

## 2022-10-20 NOTE — Progress Notes (Signed)
History and Physical Interval Note: 47 y/o female with history of Roux-en-Y here for EGD to evaluate abdominal pain, and also for her first colonoscopy. Prior CT Scan and HIDA scan without clear cause. No change from office visit, she was seen 09/25/22. Taking protonix 40mg  BID with ? Mild benefit from symptoms largely persist. Also given carafate. No EGD since her bypass. Has some loose stools at times. Grandfather, aunt / uncle have had colon cancer, first time exam. No first degree family history of colon cancer. She wishes to proceed.      10/20/2022 3:39 PM  Rodolph Bong  has presented today for endoscopic procedure(s), with the diagnosis of  Encounter Diagnoses  Name Primary?   Abdominal pain, unspecified abdominal location Yes   History of Roux-en-Y gastric bypass    Family history of colon cancer    Benign neoplasm of transverse colon   .  The various methods of evaluation and treatment have been discussed with the patient and/or family. After consideration of risks, benefits and other options for treatment, the patient has consented to  the endoscopic procedure(s).   The patient's history has been reviewed, patient examined, no change in status, stable for surgery.  I have reviewed the patient's chart and labs.  Questions were answered to the patient's satisfaction.    Harlin Rain, MD [facility]

## 2022-10-20 NOTE — Progress Notes (Signed)
VS completed by EC.   Pt's states no medical or surgical changes since previsit or office visit.  

## 2022-10-20 NOTE — Op Note (Signed)
[facility] Patient Name: Carmen Stokes Procedure Date: 10/20/2022 2:37 PM MRN: [id number] Endoscopist: Viviann Spare P. Adela Lank , MD, [id number] Age: 47 Referring MD:  Date of Birth: 1976/02/10 Gender: Female Account #: [id number] Procedure:                Upper GI endoscopy Indications:              Upper abdominal pain - history of Roux-en-Y gastric                            bypass - negative CT scan and HIDA. High dose                            protonix and carafate has not helped, EGD to                            further evaluate. Medicines:                Monitored Anesthesia Care Procedure:                Pre-Anesthesia Assessment:                           - Prior to the procedure, a History and Physical                            was performed, and patient medications and                            allergies were reviewed. The patient's tolerance of                            previous anesthesia was also reviewed. The risks                            and benefits of the procedure and the sedation                            options and risks were discussed with the patient.                            All questions were answered, and informed consent                            was obtained. Prior Anticoagulants: The patient has                            taken no anticoagulant or antiplatelet agents. ASA                            Grade Assessment: II - A patient with mild systemic                            disease. After reviewing the risks and benefits,  the patient was deemed in satisfactory condition to                            undergo the procedure.                           After obtaining informed consent, the endoscope was                            passed under direct vision. Throughout the                            procedure, the patient's blood pressure, pulse, and                            oxygen saturations were monitored  continuously. The                            GIF [id number] #[id number] was introduced through the                            mouth, and advanced to the jejunum. The upper GI                            endoscopy was accomplished without difficulty. The                            patient tolerated the procedure well. Scope In: Scope Out: Findings:                 Esophagogastric landmarks were identified: the                            Z-line was found at 36 cm, the gastroesophageal                            junction was found at 36 cm and the upper extent of                            the gastric folds was found at 37 cm from the                            incisors.                           A 1 cm hiatal hernia was present.                           The exam of the esophagus was otherwise normal.                           Evidence of a Roux-en-Y gastrojejunostomy was                            found. The gastrojejunal  anastomosis was healthy.                            Two residual staples were noted at the anastomosis                            and removed with cold forceps, but they did not                            appear to be causing any overt ulceration.                           The exam of the gastric pouch was otherwise normal.                           Biopsies were taken with a cold forceps for                            Helicobacter pylori testing.                           The examined small bowel limb was deeply intubated                            and was normal. Complications:            No immediate complications. Estimated blood loss:                            Minimal. Estimated Blood Loss:     Estimated blood loss was minimal. Impression:               - Esophagogastric landmarks identified.                           - 1 cm hiatal hernia.                           - Normal esophagus.                           - Roux-en-Y gastrojejunostomy with gastrojejunal                             anastomosis characterized by two residual staples,                            removed with cold forceps.                           - Normal gastric pouch otherwise - appropriate                            Roux-en-Y anatomy. Biopsies taken to rule out H                            pylori                           -  Normal examined small bowel limb.                           No overt cause on this exam for abdominal pain,                            will await biopsy results. Recommendation:           - Patient has a contact number available for                            emergencies. The signs and symptoms of potential                            delayed complications were discussed with the                            patient. Return to normal activities tomorrow.                            Written discharge instructions were provided to the                            patient.                           - Resume previous diet.                           - Continue present medications.                           - Await pathology results. Viviann Spare P. Keyarra Rendall, MD 10/20/2022 3:45:22 PM This report has been signed electronically.

## 2022-10-21 ENCOUNTER — Telehealth: Payer: Self-pay | Admitting: *Deleted

## 2022-10-21 NOTE — Telephone Encounter (Signed)
No answer for post procedure call back. Left VM. 

## 2023-01-14 ENCOUNTER — Other Ambulatory Visit: Payer: 59

## 2023-01-14 ENCOUNTER — Other Ambulatory Visit: Payer: Self-pay | Admitting: Gastroenterology

## 2023-01-14 ENCOUNTER — Ambulatory Visit: Payer: 59 | Admitting: Gastroenterology

## 2023-01-14 VITALS — BP 124/80 | HR 90 | Ht 67.0 in | Wt 180.0 lb

## 2023-01-14 DIAGNOSIS — K219 Gastro-esophageal reflux disease without esophagitis: Secondary | ICD-10-CM | POA: Diagnosis not present

## 2023-01-14 DIAGNOSIS — R140 Abdominal distension (gaseous): Secondary | ICD-10-CM

## 2023-01-14 DIAGNOSIS — K580 Irritable bowel syndrome with diarrhea: Secondary | ICD-10-CM

## 2023-01-14 MED ORDER — RIFAXIMIN 550 MG PO TABS: 550.0000 mg | ORAL_TABLET | Freq: Three times a day (TID) | ORAL | 0 refills | Status: DC

## 2023-01-14 MED ORDER — DICYCLOMINE HCL 10 MG PO CAPS: 10.0000 mg | ORAL_CAPSULE | Freq: Three times a day (TID) | ORAL | 1 refills | Status: AC | PRN

## 2023-01-14 NOTE — Patient Instructions (Addendum)
If your blood pressure at your visit was 140/90 or greater, please contact your primary care physician to follow up on this. ______________________________________________________  If you are age 49 or older, your body mass index should be between 23-30. Your Body mass index is 28.19 kg/m. If this is out of the aforementioned range listed, please consider follow up with your Primary Care Provider.  If you are age 19 or younger, your body mass index should be between 19-25. Your Body mass index is 28.19 kg/m. If this is out of the aformentioned range listed, please consider follow up with your Primary Care Provider.  ________________________________________________________  The [facility] providers would like to encourage you to use [facility] to communicate with providers for non-urgent requests or questions.  Due to long hold times on the telephone, sending your provider a message by [facility] may be a faster and more efficient way to get a response.  Please allow 48 business hours for a response.  Please remember that this is for non-urgent requests.  _______________________________________________________  Due to recent changes in healthcare laws, you may see the results of your imaging and laboratory studies on MyChart before your provider has had a chance to review them.  We understand that in some cases there may be results that are confusing or concerning to you. Not all laboratory results come back in the same time frame and the provider may be waiting for multiple results in order to interpret others.  Please give [place] 48 hours in order for your provider to thoroughly review all the results before contacting the office for clarification of your results.   We have sent the following medications to your pharmacy for you to pick up at your convenience: Xifaxan 550 mg: Take 1 tablet three times a day for 14 days  Bentyl 10 mg: Take 1 tablet my mouth every 8 hours as needed  Continue Imodium as  needed  Continue Pepcid for GERD.  Please go to the lab in the basement of our building to have lab work done as you leave today. Hit "B" for basement when you get on the elevator.  When the doors open the lab is on your left.  We will call you with the results. Thank you.  Thank you for entrusting me with your care and for choosing [facility], Dr. [name]

## 2023-01-14 NOTE — Progress Notes (Signed)
HPI :  47 year old female here for a follow-up visit for abdominal bloating, loose stools, GERD.  Recall she has a history of gastric bypass in February 2023.  She was seen by Willette Cluster in the office on June 6 and subsequently underwent EGD and colonoscopy with me on July 1.  She is here to review those results and discuss options moving forward.  Exam there is follows:  EGD 10/20/22: - Esophagogastric landmarks were identified: the Z-line was found at 36 cm, the gastroesophageal junction was found at 36 cm and the upper extent of the gastric folds was found at 37 cm from the incisors. Findings: - A 1 cm hiatal hernia was present. - The exam of the esophagus was otherwise normal. - Evidence of a Roux-en-Y gastrojejunostomy was found. The gastrojejunal anastomosis was healthy. Two residual staples were noted at the anastomosis and removed with cold forceps, but they did not appear to be causing any overt ulceration. - The exam of the gastric pouch was otherwise normal. - Biopsies were taken with a cold forceps for Helicobacter pylori testing. - The examined small bowel limb was deeply intubated and was normal.  Colonoscopy 10/20/22: - The perianal and digital rectal examinations were normal. - The terminal ileum appeared normal. - A diminutive polyp was found in the transverse colon. The polyp was flat. The polyp was removed with a cold biopsy forceps. Resection and retrieval were complete. - Internal hemorrhoids were found during retroflexion. The hemorrhoids were small. - The exam was otherwise without abnormality. - Biopsies for histology were taken with a cold forceps from the right colon, left colon and transverse colon for evaluation of microscopic colitis.   1. Surgical [P], gastric GASTRIC ANTRAL/OXYNTIC MUCOSA WITH NO SIGNIFICANT DIAGNOSTIC ALTERATION. NO H.PYLORI IDENTIFIED ON H&E STAIN. 2. Surgical [P], random colon sites COLONIC MUCOSA WITH NO SIGNIFICANT DIAGNOSTIC ALTERATION. NO  EVIDENCE OF LYMPHOCYTIC COLITIS OR COLLAGENOUS COLITIS. NO EVIDENCE OF ACTIVITY, CHRONICITY, DYSPLASIA, GRANULOMA OR MALIGNANCY. 3. Surgical [P], colon, transverse, polyp (1) TUBULAR ADENOMA. NEGATIVE FOR HIGH-GRADE DYSPLASIA.  She reports her main complaint is having intermittent abdominal bloating and distention.  This occurs sporadically but can cause pain in the left upper quadrant where she states it can feel like a gas bubble is trapped there.  Symptoms will generally abate over time.  She has some postprandial loose stools that bother her intermittently.  She has a hard time identifying any clear food triggers.  She does not think she has any dairy or other food intolerances.  Her bowels are occasionally normal, occasionally loose.  She states symptoms have been ongoing for the past year.  Her gastric bypass was 6 months at least before the symptoms started.  Otherwise she has been having some more pyrosis lately.  Her EGD showed no damage from reflux.  She previously was on Prilosec in the past, status post gastric bypass her symptoms improved, she was on Protonix for period of time but then stopped it.  More recently she was placed back on Pepcid and this seems to do a fair job at controlling her symptoms and she she is happy with the regimen so far. No dysphagia.  Recall she had a CT scan in April as below which was unremarkable.    CT Scan 07/25/22: IMPRESSION: No acute pathology identified in the abdomen and pelvis.    Past Medical History:  Diagnosis Date   Allergy    Anxiety    Asthma    Depression  GERD (gastroesophageal reflux disease)    PONV (postoperative nausea and vomiting)    Sleep apnea      Past Surgical History:  Procedure Laterality Date   DILITATION & CURRETTAGE/HYSTROSCOPY WITH NOVASURE ABLATION N/A 12/02/2018   Procedure: DILATATION & CURETTAGE/HYSTEROSCOPY WITH NOVASURE ABLATION;  Surgeon: Mitchel Honour, DO;  Location: [facility];   Service: Gynecology;  Laterality: N/A;   ESOPHAGOGASTRODUODENOSCOPY     age 89 or 22 due to stomach issues   GASTRIC ROUX-EN-Y N/A 05/28/2021   Procedure: LAPAROSCOPIC ROUX-EN-Y GASTRIC BYPASS WITH UPPER ENDOSCOPY, SMALL BOWEL MESSENTERIC LYMPH NODE BIOPSY;  Surgeon: Gaynelle Adu, MD;  Location: [facility];  Service: General;  Laterality: N/A;   HIATAL HERNIA REPAIR N/A 05/28/2021   Procedure: HERNIA REPAIR HIATAL;  Surgeon: Gaynelle Adu, MD;  Location: [facility];  Service: General;  Laterality: N/A;   LAPAROSCOPIC TUBAL LIGATION Bilateral 12/02/2018   Procedure: LAPAROSCOPIC TUBAL LIGATION;  Surgeon: Mitchel Honour, DO;  Location: [facility];  Service: Gynecology;  Laterality: Bilateral;   LAPAROSCOPIC VAGINAL HYSTERECTOMY WITH SALPINGECTOMY Bilateral 01/16/2020   Procedure: LAPAROSCOPIC ASSISTED VAGINAL HYSTERECTOMY WITH SALPINGECTOMY;  Surgeon: Mitchel Honour, DO;  Location: [facility];  Service: Gynecology;  Laterality: Bilateral;   SEPTOPLASTY  2020   SINOSCOPY     WISDOM TOOTH EXTRACTION     Family History  Problem Relation Age of Onset   Arthritis Mother    Cancer Mother    Thyroid disease Mother    Depression Father    Hyperlipidemia Brother    Hypertension Brother    Colon cancer Maternal Grandfather    Thyroid disease Maternal Aunt    Social History   Tobacco Use   Smoking status: Former    Current packs/day: 0.00    Types: Cigarettes    Quit date: 04/22/2011    Years since quitting: 11.7   Smokeless tobacco: Never  Vaping Use   Vaping status: Never Used  Substance Use Topics   Alcohol use: No   Drug use: No   Current Outpatient Medications  Medication Sig Dispense Refill   dicyclomine (BENTYL) 10 MG capsule Take 1 capsule (10 mg total) by mouth every 8 (eight) hours as needed for spasms. 30 capsule 1   estradiol (CLIMARA - DOSED IN MG/24 HR) 0.05 mg/24hr patch Place 0.05 mg onto the skin once a week.     famotidine (PEPCID) 10 MG tablet  Take 10 mg by mouth daily.     Multiple Vitamins-Minerals (MULTIVITAMIN WITH MINERALS) tablet Take 1 tablet by mouth daily.     rifaximin (XIFAXAN) 550 MG TABS tablet Take 1 tablet (550 mg total) by mouth 3 (three) times daily for 14 days. 20 tablet 0   albuterol (VENTOLIN HFA) 108 (90 Base) MCG/ACT inhaler 2 puffs every 4-6 hours as needed. (Patient not taking: Reported on 01/14/2023) 18 g 1   ALPRAZolam (XANAX) 0.5 MG tablet Take 1 tablet by mouth every 6 (six) hours as needed. (Patient not taking: Reported on 01/14/2023)     Budeson-Glycopyrrol-Formoterol (BREZTRI AEROSPHERE) 160-9-4.8 MCG/ACT AERO Inhale 2 puffs into the lungs in the morning and at bedtime. (Patient not taking: Reported on 01/14/2023) 10.9 g 3   fluticasone (FLONASE) 50 MCG/ACT nasal spray SMARTSIG:1-2 Spray(s) Both Nares Daily PRN (Patient not taking: Reported on 01/14/2023)     ondansetron (ZOFRAN-ODT) 4 MG disintegrating tablet Take 1 tablet (4 mg total) by mouth every 6 (six) hours as needed for nausea or vomiting. (Patient not taking: Reported on 01/14/2023)  20 tablet 0   Probiotic Product (PROBIOTIC DAILY PO) Take 1 capsule by mouth daily. (Patient not taking: Reported on 01/14/2023)     sucralfate (CARAFATE) 1 g tablet Take 1 g by mouth. (Patient not taking: Reported on 01/14/2023)     traMADol (ULTRAM) 50 MG tablet Take 1 tablet (50 mg total) by mouth every 6 (six) hours as needed (pain). (Patient not taking: Reported on 01/14/2023) 10 tablet 0   venlafaxine (EFFEXOR) 37.5 MG tablet Take 1 tablet (37.5 mg total) by mouth 2 (two) times daily with a meal. 60 tablet 1   zinc gluconate 50 MG tablet Take 50 mg by mouth daily. (Patient not taking: Reported on 01/14/2023)     No current facility-administered medications for this visit.   No Known Allergies   Review of Systems: All systems reviewed and negative except where noted in HPI.   Lab Results  Component Value Date   WBC 5.3 07/15/2022   HGB 13.1 07/15/2022   HCT 39.4  07/15/2022   MCV 91.3 07/15/2022   PLT 224.0 07/15/2022    Lab Results  Component Value Date   NA 139 07/15/2022   CL 104 07/15/2022   K 3.9 07/15/2022   CO2 28 07/15/2022   BUN 11 07/15/2022   CREATININE 0.89 07/15/2022   GFR 76.47 07/15/2022   CALCIUM 9.5 07/15/2022   ALBUMIN 4.5 07/15/2022   GLUCOSE 56 (L) 07/15/2022    Lab Results  Component Value Date   ALT 17 07/15/2022   AST 20 07/15/2022   ALKPHOS 68 07/15/2022   BILITOT 0.6 07/15/2022     Physical Exam: BP 124/80   Pulse 90   Ht 5\' 7"  (1.702 m)   Wt 180 lb (81.6 kg)   LMP 12/31/2019   BMI 28.19 kg/m  Constitutional: Pleasant,well-developed, female in no acute distress. Skin: Skin is warm and dry. No rashes noted. Psychiatric: Normal mood and affect. Behavior is normal.   ASSESSMENT: 47 y.o. female here for assessment of the following  1. Irritable bowel syndrome with diarrhea   2. Bloating   3. Gastroesophageal reflux disease without esophagitis    Suspect she had a functional change in her bowels in recent years given her workup today, she is post gastric bypass.  Significant gas and bloating with altered bowel habits.  Discussed some options with her.  Given significant component of gas and bloating think she may respond well to rifaximin.  We discussed this, will try rifaximin 550 mg 3 times daily for 2 weeks if her insurance will cover this.  In the interim we will also add some Bentyl 10 mg every 8 hours as needed to see if this helps as well.  She can additionally take Imodium as needed for breakthrough symptoms.  While her EGD was remarkable for a normal-appearing small bowel, will send serologies for celiac to make sure normal.  Otherwise counseled her on some dietary measures and will give her a handout for low FODMAP diet to see if there is any trigger foods that she is eating more routinely that could be related to the symptoms.  If no better with these interventions over the next few weeks she  should contact me for reassessment to discuss other options.  Otherwise, she has had a history of some reflux intermittently.  On PPI in the past.  Had been doing okay but symptoms recently recurred and taking Pepcid which is working well for her.  As long as this is working well she  should continue that.  If we need to escalate to PPI can do so in the future.  EGD showed no evidence of Barrett's esophagus.  PLAN: - prescribe Rifaximin 550mg  TID for 2 weeks - trial of bentyl 10mg  every 8 hours PRN #[id number] RF1 - can add immodium PRN - lab today for celiac serology  - trial of low FODMAP diet, handout emailed via Mychart - continue pepcid for GERD, working well for her - follow up as needed, contact me in a few weeks if no improvement.  Harlin Rain, MD [facility]

## 2023-01-15 LAB — IGA: Immunoglobulin A: 133 mg/dL (ref 47–310)

## 2023-01-15 LAB — TISSUE TRANSGLUTAMINASE, IGA: (tTG) Ab, IgA: <1 U/mL

## 2023-12-22 ENCOUNTER — Encounter (HOSPITAL_COMMUNITY): Payer: Self-pay | Admitting: *Deleted
